# Patient Record
Sex: Female | Born: 2000
Health system: Southern US, Community
[De-identification: ages and names within clinical notes are randomized; demographics above are authoritative.]

## PROBLEM LIST (undated history)

## (undated) DIAGNOSIS — G43909 Migraine, unspecified, not intractable, without status migrainosus: Secondary | ICD-10-CM

## (undated) HISTORY — DX: Migraine, unspecified, not intractable, without status migrainosus: G43.909

---
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
## 2020-01-08 MED FILL — MEDROXYPROGESTERONE ACETATE: 150 | 90 days supply | Qty: 1 | Fill #1

## 2020-01-14 DIAGNOSIS — Z3042 Encounter for surveillance of injectable contraceptive: Secondary | ICD-10-CM | POA: Diagnosis not present

## 2020-01-14 DIAGNOSIS — Z3202 Encounter for pregnancy test, result negative: Secondary | ICD-10-CM | POA: Diagnosis not present

## 2020-04-03 MED FILL — MEDROXYPROGESTERONE ACETATE: 150 | 90 days supply | Qty: 1 | Fill #2

## 2020-04-07 DIAGNOSIS — Z3202 Encounter for pregnancy test, result negative: Secondary | ICD-10-CM | POA: Diagnosis not present

## 2020-04-07 DIAGNOSIS — Z3042 Encounter for surveillance of injectable contraceptive: Secondary | ICD-10-CM | POA: Diagnosis not present

## 2020-05-11 DIAGNOSIS — R300 Dysuria: Secondary | ICD-10-CM | POA: Diagnosis not present

## 2020-05-11 DIAGNOSIS — Z3009 Encounter for other general counseling and advice on contraception: Secondary | ICD-10-CM | POA: Diagnosis not present

## 2020-05-11 DIAGNOSIS — R39198 Other difficulties with micturition: Secondary | ICD-10-CM | POA: Diagnosis not present

## 2020-05-11 DIAGNOSIS — N390 Urinary tract infection, site not specified: Secondary | ICD-10-CM | POA: Diagnosis not present

## 2020-05-11 DIAGNOSIS — Z68.41 Body mass index (BMI) pediatric, greater than or equal to 95th percentile for age: Secondary | ICD-10-CM | POA: Diagnosis not present

## 2020-06-01 DIAGNOSIS — R300 Dysuria: Secondary | ICD-10-CM | POA: Diagnosis not present

## 2020-07-04 DIAGNOSIS — Z68.41 Body mass index (BMI) pediatric, greater than or equal to 95th percentile for age: Secondary | ICD-10-CM | POA: Diagnosis not present

## 2020-07-04 DIAGNOSIS — E669 Obesity, unspecified: Secondary | ICD-10-CM | POA: Diagnosis not present

## 2020-07-04 DIAGNOSIS — Z309 Encounter for contraceptive management, unspecified: Secondary | ICD-10-CM | POA: Diagnosis not present

## 2020-07-04 DIAGNOSIS — Z113 Encounter for screening for infections with a predominantly sexual mode of transmission: Secondary | ICD-10-CM | POA: Diagnosis not present

## 2020-07-07 MED FILL — medroxyPROGESTERone ACETATE: 150 | 90 days supply | Qty: 1 | Fill #3

## 2020-07-08 DIAGNOSIS — Z3042 Encounter for surveillance of injectable contraceptive: Secondary | ICD-10-CM | POA: Diagnosis not present

## 2020-07-08 DIAGNOSIS — Z3202 Encounter for pregnancy test, result negative: Secondary | ICD-10-CM | POA: Diagnosis not present

## 2020-09-09 ENCOUNTER — Other Ambulatory Visit: Payer: Self-pay

## 2020-09-09 ENCOUNTER — Encounter: Payer: Self-pay | Admitting: Emergency Medicine

## 2020-09-09 ENCOUNTER — Ambulatory Visit
Admission: EM | Admit: 2020-09-09 | Discharge: 2020-09-09 | Disposition: A | Discharge status: Home / Self Care | Payer: 59 | Attending: Emergency Medicine | Admitting: Emergency Medicine

## 2020-09-09 ENCOUNTER — Ambulatory Visit: Admit: 2020-09-09 | Disposition: A | Discharge status: Home / Self Care | Payer: Self-pay

## 2020-09-09 DIAGNOSIS — Z20822 Contact with and (suspected) exposure to covid-19: Secondary | ICD-10-CM | POA: Diagnosis not present

## 2020-09-09 DIAGNOSIS — J019 Acute sinusitis, unspecified: Secondary | ICD-10-CM | POA: Diagnosis not present

## 2020-09-09 MED ORDER — CETIRIZINE HCL 10 MG PO CAPS: 10.0000 mg | ORAL_CAPSULE | Freq: Every day | ORAL | 0 refills | Status: DC

## 2020-09-09 MED ORDER — FLUTICASONE PROPIONATE 50 MCG/ACT NA SUSP: 1.0000 | Freq: Every day | NASAL | 0 refills | Status: DC

## 2020-09-09 MED ORDER — IBUPROFEN 800 MG PO TABS: 800.0000 mg | ORAL_TABLET | Freq: Three times a day (TID) | ORAL | 0 refills | Status: DC

## 2020-09-09 MED ORDER — AMOXICILLIN-POT CLAVULANATE 875-125 MG PO TABS: 1.0000 | ORAL_TABLET | Freq: Two times a day (BID) | ORAL | 0 refills | Status: AC

## 2020-09-09 NOTE — ED Provider Notes (Signed)
[name]    CSN: [id number] Arrival date & time: 09/09/20  1204      History   Chief Complaint Chief Complaint  Patient presents with  . Nasal Congestion    HPI Laura [name] is a 20 y.o. female presenting today for evaluation of nasal congestion.  Reports that she has had URI symptoms for approximately 1 week.  Reports associated fatigue.  Did at home Covid test which was negative.  Reports a lot of pressure on left side of face and sensation of feeling congested, denies rhinorrhea.  Mild sore throat.  And frequent coughing related to drainage in throat.  Reports symptoms have been going on for 1 week, reports improved over the weekend, but worsened over the past 1 to 2 days.  HPI  History reviewed. No pertinent past medical history.  There are no problems to display for this patient.   History reviewed. No pertinent surgical history.  OB History   No obstetric history on file.      Home Medications    Prior to Admission medications   Medication Sig Start Date End Date Taking? Authorizing Provider  amoxicillin-clavulanate (AUGMENTIN) 875-125 MG tablet Take 1 tablet by mouth every 12 (twelve) hours for 7 days. 09/09/20 09/16/20 Yes Deetya Drouillard C, PA-C  Cetirizine HCl 10 MG CAPS Take 1 capsule (10 mg total) by mouth daily for 10 days. 09/09/20 09/19/20 Yes Donnamaria Shands C, PA-C  fluticasone (FLONASE) 50 MCG/ACT nasal spray Place 1-2 sprays into both nostrils daily. 09/09/20  Yes Kayna Suppa C, PA-C  ibuprofen (ADVIL) 800 MG tablet Take 1 tablet (800 mg total) by mouth 3 (three) times daily. 09/09/20  Yes Violette Morneault, Junius Creamer, PA-C    Family History History reviewed. No pertinent family history.  Social History     Allergies   Patient has no known allergies.   Review of Systems Review of Systems  Constitutional: Negative for activity change, appetite change, chills, fatigue and fever.  HENT: Positive for congestion, rhinorrhea and sinus  pressure. Negative for ear pain, sore throat and trouble swallowing.   Eyes: Negative for discharge and redness.  Respiratory: Negative for cough, chest tightness and shortness of breath.   Cardiovascular: Negative for chest pain.  Gastrointestinal: Negative for abdominal pain, diarrhea, nausea and vomiting.  Musculoskeletal: Negative for myalgias.  Skin: Negative for rash.  Neurological: Negative for dizziness, light-headedness and headaches.     Physical Exam Triage Vital Signs ED Triage Vitals [09/09/20 1231]  Enc Vitals Group     BP 122/67     Pulse Rate 90     Resp 18     Temp 98.7 F (37.1 C)     Temp Source Oral     SpO2 96 %     Weight      Height      Head Circumference      Peak Flow      Pain Score 4     Pain Loc      Pain Edu?      Excl. in GC?    No data found.  Updated Vital Signs BP 122/67 (BP Location: Left Arm)   Pulse 90   Temp 98.7 F (37.1 C) (Oral)   Resp 18   SpO2 96%   Visual Acuity Right Eye Distance:   Left Eye Distance:   Bilateral Distance:    Right Eye Near:   Left Eye Near:    Bilateral Near:     Physical Exam  Vitals and nursing note reviewed.  Constitutional:      Appearance: She is well-developed and well-nourished.     Comments: No acute distress  HENT:     Head: Normocephalic and atraumatic.     Ears:     Comments: Bilateral ears without tenderness to palpation of external auricle, tragus and mastoid, EAC's without erythema or swelling, TM's with good bony landmarks and cone of light. Non erythematous.     Nose: Nose normal.     Mouth/Throat:     Comments: Oral mucosa pink and moist, no tonsillar enlargement or exudate. Posterior pharynx patent and nonerythematous, no uvula deviation or swelling. Normal phonation. Eyes:     Conjunctiva/sclera: Conjunctivae normal.  Cardiovascular:     Rate and Rhythm: Normal rate.  Pulmonary:     Effort: Pulmonary effort is normal. No respiratory distress.     Comments: Breathing  comfortably at rest, CTABL, no wheezing, rales or other adventitious sounds auscultated Abdominal:     General: There is no distension.  Musculoskeletal:        General: Normal range of motion.     Cervical back: Neck supple.  Skin:    General: Skin is warm and dry.  Neurological:     Mental Status: She is alert and oriented to person, place, and time.  Psychiatric:        Mood and Affect: Mood and affect normal.      UC Treatments / Results  Labs (all labs ordered are listed, but only abnormal results are displayed) Labs Reviewed  NOVEL CORONAVIRUS, NAA    EKG   Radiology No results found.  Procedures Procedures (including critical care time)  Medications Ordered in UC Medications - No data to display  Initial Impression / Assessment and Plan / UC Course  I have reviewed the triage vital signs and the nursing notes.  Pertinent labs & imaging results that were available during my care of the patient were reviewed by me and considered in my medical decision making (see chart for details).     Treating for sinusitis-symptoms x1 week with double sickening, initiating on Augmentin, Flonase and Zyrtec, anti-inflammatories for headaches, rest and fluids.  Covid PCR pending to ensure rapid at home not a false negative.  Discussed strict return precautions. Patient verbalized understanding and is agreeable with plan.  Final Clinical Impressions(s) / UC Diagnoses   Final diagnoses:  Encounter for screening laboratory testing for COVID-19 virus  Acute sinusitis with symptoms > 10 days     Discharge Instructions     Begin Augmentin twice daily for the next week to treat sinus infection Flonase nasal spray and cetirizine daily to further help with congestion and drainage Ibuprofen and Tylenol for headaches Rest and drink plenty of fluids Follow-up if not improving or worsening    ED Prescriptions    Medication Sig Dispense Auth. Provider   amoxicillin-clavulanate  (AUGMENTIN) 875-125 MG tablet Take 1 tablet by mouth every 12 (twelve) hours for 7 days. 14 tablet Nerida Boivin C, PA-C   fluticasone (FLONASE) 50 MCG/ACT nasal spray Place 1-2 sprays into both nostrils daily. 16 g Elexia Friedt C, PA-C   Cetirizine HCl 10 MG CAPS Take 1 capsule (10 mg total) by mouth daily for 10 days. 10 capsule Azavion Bouillon C, PA-C   ibuprofen (ADVIL) 800 MG tablet Take 1 tablet (800 mg total) by mouth 3 (three) times daily. 21 tablet Talmadge Ganas, [place] C, PA-C     PDMP not reviewed this encounter.  Lew Dawes, [place] 09/09/20 1412

## 2020-09-09 NOTE — Discharge Instructions (Signed)
Begin Augmentin twice daily for the next week to treat sinus infection Flonase nasal spray and cetirizine daily to further help with congestion and drainage Ibuprofen and Tylenol for headaches Rest and drink plenty of fluids Follow-up if not improving or worsening

## 2020-09-09 NOTE — ED Triage Notes (Signed)
Pt sts nasal congestion and fatigue x 1 week; pt did home covid test was negative; pt sts not improved

## 2020-09-10 LAB — NOVEL CORONAVIRUS, NAA: SARS-CoV-2, NAA: NOT DETECTED

## 2020-09-10 LAB — SARS-COV-2, NAA 2 DAY TAT

## 2020-09-17 ENCOUNTER — Other Ambulatory Visit: Payer: Self-pay

## 2020-09-17 ENCOUNTER — Ambulatory Visit
Admission: RE | Admit: 2020-09-17 | Discharge: 2020-09-17 | Disposition: A | Discharge status: Home / Self Care | Payer: 59 | Source: Ambulatory Visit | Attending: Emergency Medicine | Admitting: Emergency Medicine

## 2020-09-17 VITALS — BP 110/66 | HR 96 | Temp 99.1°F | Resp 18

## 2020-09-17 DIAGNOSIS — Z113 Encounter for screening for infections with a predominantly sexual mode of transmission: Secondary | ICD-10-CM | POA: Diagnosis not present

## 2020-09-17 DIAGNOSIS — N898 Other specified noninflammatory disorders of vagina: Secondary | ICD-10-CM | POA: Insufficient documentation

## 2020-09-17 LAB — POCT URINALYSIS DIP (MANUAL ENTRY)
Bilirubin, UA: NEGATIVE
Blood, UA: NEGATIVE
Glucose, UA: NEGATIVE mg/dL
Ketones, POC UA: NEGATIVE mg/dL
Nitrite, UA: NEGATIVE
Protein Ur, POC: NEGATIVE mg/dL
Spec Grav, UA: 1.01 (ref 1.010–1.025)
Urobilinogen, UA: 0.2 E.U./dL
pH, UA: 6 (ref 5.0–8.0)

## 2020-09-17 LAB — POCT URINE PREGNANCY: Preg Test, Ur: NEGATIVE

## 2020-09-17 NOTE — ED Provider Notes (Signed)
[name]    CSN: [id number] Arrival date & time: 09/17/20  1133      History   Chief Complaint Chief Complaint  Patient presents with  . Appointment    1200  . Exposure to STD    HPI Laura [name] is a 20 y.o. female presenting today for evaluation of vaginal discharge.  Reports that she would like to be screened for STDs as well as UTI.  She does report some white discharge without itching or irritation.  Denies any new partners.  Denies urinary symptoms.  She receives Depo injections regularly, denies menstrual cycles with Depo.  She did notice slight discoloration to her clitoris, but denies associated pain or itching with this.  HPI  History reviewed. No pertinent past medical history.  There are no problems to display for this patient.   History reviewed. No pertinent surgical history.  OB History   No obstetric history on file.      Home Medications    Prior to Admission medications   Medication Sig Start Date End Date Taking? Authorizing Provider  Cetirizine HCl 10 MG CAPS Take 1 capsule (10 mg total) by mouth daily for 10 days. 09/09/20 09/19/20  Mahmud Keithly C, PA-C  fluticasone (FLONASE) 50 MCG/ACT nasal spray Place 1-2 sprays into both nostrils daily. 09/09/20   Digby Groeneveld C, PA-C  ibuprofen (ADVIL) 800 MG tablet Take 1 tablet (800 mg total) by mouth 3 (three) times daily. 09/09/20   Keria Widrig, Junius Creamer, PA-C    Family History Family History  Family history unknown: Yes    Social History Social History   Tobacco Use  . Smoking status: Never Smoker  . Smokeless tobacco: Never Used  Substance Use Topics  . Alcohol use: Not Currently  . Drug use: Never     Allergies   Patient has no known allergies.   Review of Systems Review of Systems  Constitutional: Negative for fever.  Respiratory: Negative for shortness of breath.   Cardiovascular: Negative for chest pain.  Gastrointestinal: Negative for abdominal pain, diarrhea,  nausea and vomiting.  Genitourinary: Positive for vaginal discharge. Negative for dysuria, flank pain, genital sores, hematuria, menstrual problem, vaginal bleeding and vaginal pain.  Musculoskeletal: Negative for back pain.  Skin: Negative for rash.  Neurological: Negative for dizziness, light-headedness and headaches.     Physical Exam Triage Vital Signs ED Triage Vitals  Enc Vitals Group     BP 09/17/20 1150 110/66     Pulse Rate 09/17/20 1150 96     Resp 09/17/20 1150 18     Temp 09/17/20 1150 99.1 F (37.3 C)     Temp Source 09/17/20 1150 Oral     SpO2 09/17/20 1150 95 %     Weight --      Height --      Head Circumference --      Peak Flow --      Pain Score 09/17/20 1151 3     Pain Loc --      Pain Edu? --      Excl. in GC? --    No data found.  Updated Vital Signs BP 110/66 (BP Location: Left Arm)   Pulse 96   Temp 99.1 F (37.3 C) (Oral)   Resp 18   SpO2 95%   Visual Acuity Right Eye Distance:   Left Eye Distance:   Bilateral Distance:    Right Eye Near:   Left Eye Near:    Bilateral Near:  Physical Exam Vitals and nursing note reviewed.  Constitutional:      Appearance: She is well-developed and well-nourished.     Comments: No acute distress  HENT:     Head: Normocephalic and atraumatic.     Nose: Nose normal.  Eyes:     Conjunctiva/sclera: Conjunctivae normal.  Cardiovascular:     Rate and Rhythm: Normal rate.  Pulmonary:     Effort: Pulmonary effort is normal. No respiratory distress.  Abdominal:     General: There is no distension.  Genitourinary:    Comments: No obvious rashes or lesions noted to labia majora and minora, pubic area, small feeling of mucosal.  Noted at crease of clitoral hood and clitoris, no open lesions or sores Musculoskeletal:        General: Normal range of motion.     Cervical back: Neck supple.  Skin:    General: Skin is warm and dry.  Neurological:     Mental Status: She is alert and oriented to person,  place, and time.  Psychiatric:        Mood and Affect: Mood and affect normal.      UC Treatments / Results  Labs (all labs ordered are listed, but only abnormal results are displayed) Labs Reviewed  POCT URINALYSIS DIP (MANUAL ENTRY) - Abnormal; Notable for the following components:      Result Value   Leukocytes, UA Trace (*)    All other components within normal limits  POCT URINE PREGNANCY  CERVICOVAGINAL ANCILLARY ONLY    EKG   Radiology No results found.  Procedures Procedures (including critical care time)  Medications Ordered in UC Medications - No data to display  Initial Impression / Assessment and Plan / UC Course  I have reviewed the triage vital signs and the nursing notes.  Pertinent labs & imaging results that were available during my care of the patient were reviewed by me and considered in my medical decision making (see chart for details).     UA unremarkable, pregnancy test negative, vaginal swab pending for screening of discharge.  Deferring empiric treatment, will call with results as needed.  Discussed strict return precautions. Patient verbalized understanding and is agreeable with plan.  Final Clinical Impressions(s) / UC Diagnoses   Final diagnoses:  Vaginal discharge  Screen for STD (sexually transmitted disease)     Discharge Instructions     We are testing you for Gonorrhea, Chlamydia, Trichomonas, Yeast and Bacterial Vaginosis. We will call you if anything is positive and let you know if you require any further treatment. Please inform partners of any positive results.   Please return if symptoms not improving with treatment, development of fever, nausea, vomiting, abdominal pain.     ED Prescriptions    None     PDMP not reviewed this encounter.   Lew Dawes, PA-C 09/17/20 1302

## 2020-09-17 NOTE — Discharge Instructions (Signed)
We are testing you for Gonorrhea, Chlamydia, Trichomonas, Yeast and Bacterial Vaginosis. We will call you if anything is positive and let you know if you require any further treatment. Please inform partners of any positive results.   Please return if symptoms not improving with treatment, development of fever, nausea, vomiting, abdominal pain.  

## 2020-09-17 NOTE — ED Triage Notes (Signed)
Pt here for STD check as well as check for UTI; pt sts vaginal discharge

## 2020-09-18 ENCOUNTER — Telehealth (HOSPITAL_COMMUNITY): Payer: Self-pay | Admitting: Emergency Medicine

## 2020-09-18 LAB — CERVICOVAGINAL ANCILLARY ONLY
Bacterial Vaginitis (gardnerella): NEGATIVE
Candida Glabrata: NEGATIVE
Candida Vaginitis: POSITIVE — AB
Chlamydia: NEGATIVE
Comment: NEGATIVE
Comment: NEGATIVE
Comment: NEGATIVE
Comment: NEGATIVE
Comment: NEGATIVE
Comment: NORMAL
Neisseria Gonorrhea: NEGATIVE
Trichomonas: NEGATIVE

## 2020-09-18 MED ORDER — FLUCONAZOLE 150 MG PO TABS: 150.0000 mg | ORAL_TABLET | Freq: Once | ORAL | 0 refills | Status: AC

## 2020-09-23 ENCOUNTER — Other Ambulatory Visit (HOSPITAL_COMMUNITY): Payer: Self-pay | Admitting: Pediatrics

## 2020-09-23 MED FILL — medroxyPROGESTERone ACETATE: 150 | 90 days supply | Qty: 1 | Fill #0

## 2020-09-26 DIAGNOSIS — Z3042 Encounter for surveillance of injectable contraceptive: Secondary | ICD-10-CM | POA: Diagnosis not present

## 2020-09-26 DIAGNOSIS — Z3202 Encounter for pregnancy test, result negative: Secondary | ICD-10-CM | POA: Diagnosis not present

## 2020-10-08 ENCOUNTER — Other Ambulatory Visit: Payer: Self-pay

## 2020-10-08 ENCOUNTER — Ambulatory Visit
Admission: RE | Admit: 2020-10-08 | Discharge: 2020-10-08 | Disposition: A | Discharge status: Home / Self Care | Payer: 59 | Source: Ambulatory Visit | Attending: Family Medicine | Admitting: Family Medicine

## 2020-10-08 VITALS — BP 107/67 | HR 105 | Temp 98.4°F | Resp 18

## 2020-10-08 DIAGNOSIS — W57XXXA Bitten or stung by nonvenomous insect and other nonvenomous arthropods, initial encounter: Secondary | ICD-10-CM | POA: Diagnosis not present

## 2020-10-08 DIAGNOSIS — R21 Rash and other nonspecific skin eruption: Secondary | ICD-10-CM

## 2020-10-08 MED ORDER — PREDNISONE 20 MG PO TABS: 40.0000 mg | ORAL_TABLET | Freq: Every day | ORAL | 0 refills | Status: DC

## 2020-10-08 MED ORDER — TRIAMCINOLONE ACETONIDE 0.1 % EX CREA: 1.0000 "application " | TOPICAL_CREAM | Freq: Two times a day (BID) | CUTANEOUS | 0 refills | Status: DC | PRN

## 2020-10-08 NOTE — ED Triage Notes (Signed)
Pt presents with bug bites all over body after jet skiing in [place] last week

## 2020-10-08 NOTE — ED Provider Notes (Signed)
[name]    CSN: [id number] Arrival date & time: 10/08/20  1037      History   Chief Complaint Chief Complaint  Patient presents with  . Insect Bite    HPI Laura [name] is a 20 y.o. female.   Patient presenting today with itchy bug bites all over her body excluding face that started while jet skiing in [place] on vacation 3 days ago.  She states they get very red and swollen particularly after a warm bath.  She has been trying over-the-counter Aveeno itch cream, oatmeal baths, moisturizers and antihistamines without relief.  Denies fever, chills, drainage, wheezing, shortness of breath, nausea.     No past medical history on file.  There are no problems to display for this patient.   No past surgical history on file.  OB History   No obstetric history on file.      Home Medications    Prior to Admission medications   Medication Sig Start Date End Date Taking? Authorizing Provider  predniSONE (DELTASONE) 20 MG tablet Take 2 tablets (40 mg total) by mouth daily with breakfast. 10/08/20  Yes Particia Nearing, PA-C  triamcinolone (KENALOG) 0.1 % Apply 1 application topically 2 (two) times daily as needed. 10/08/20  Yes Particia Nearing, PA-C  Cetirizine HCl 10 MG CAPS Take 1 capsule (10 mg total) by mouth daily for 10 days. 09/09/20 09/19/20  Wieters, Hallie C, PA-C  fluticasone (FLONASE) 50 MCG/ACT nasal spray Place 1-2 sprays into both nostrils daily. 09/09/20   Wieters, Hallie C, PA-C  ibuprofen (ADVIL) 800 MG tablet Take 1 tablet (800 mg total) by mouth 3 (three) times daily. 09/09/20   Wieters, Junius Creamer, PA-C    Family History Family History  Family history unknown: Yes    Social History Social History   Tobacco Use  . Smoking status: Never Smoker  . Smokeless tobacco: Never Used  Substance Use Topics  . Alcohol use: Not Currently  . Drug use: Never     Allergies   Patient has no known allergies.   Review of Systems Review  of Systems Per HPI  Physical Exam Triage Vital Signs ED Triage Vitals [10/08/20 1055]  Enc Vitals Group     BP 107/67     Pulse Rate (!) 105     Resp 18     Temp 98.4 F (36.9 C)     Temp src      SpO2 97 %     Weight      Height      Head Circumference      Peak Flow      Pain Score      Pain Loc      Pain Edu?      Excl. in GC?    No data found.  Updated Vital Signs BP 107/67   Pulse (!) 105   Temp 98.4 F (36.9 C)   Resp 18   SpO2 97%   Visual Acuity Right Eye Distance:   Left Eye Distance:   Bilateral Distance:    Right Eye Near:   Left Eye Near:    Bilateral Near:     Physical Exam Vitals and nursing note reviewed.  Constitutional:      Appearance: Normal appearance. She is not ill-appearing.  HENT:     Head: Atraumatic.  Eyes:     Extraocular Movements: Extraocular movements intact.     Conjunctiva/sclera: Conjunctivae normal.  Cardiovascular:  Rate and Rhythm: Normal rate and regular rhythm.     Heart sounds: Normal heart sounds.  Pulmonary:     Effort: Pulmonary effort is normal.     Breath sounds: Normal breath sounds.  Musculoskeletal:        General: Normal range of motion.     Cervical back: Normal range of motion and neck supple.  Skin:    General: Skin is warm and dry.     Comments: Erythematous papular rash across extremities and trunk, at some places in clusters but mostly sporadic placement.  No drainage or surrounding erythema  Neurological:     Mental Status: She is alert and oriented to person, place, and time.  Psychiatric:        Mood and Affect: Mood normal.        Thought Content: Thought content normal.        Judgment: Judgment normal.      UC Treatments / Results  Labs (all labs ordered are listed, but only abnormal results are displayed) Labs Reviewed - No data to display  EKG   Radiology No results found.  Procedures Procedures (including critical care time)  Medications Ordered in UC Medications -  No data to display  Initial Impression / Assessment and Plan / UC Course  I have reviewed the triage vital signs and the nursing notes.  Pertinent labs & imaging results that were available during my care of the patient were reviewed by me and considered in my medical decision making (see chart for details).     Consistent with significant amount of insect bites, unclear if mosquito or otherwise.  We will treat with prednisone burst, triamcinolone cream as needed, antihistamines, good moisturizer regimen.  Follow-up with primary care if not fully resolving.  Final Clinical Impressions(s) / UC Diagnoses   Final diagnoses:  Rash  Insect bite, unspecified site, initial encounter   Discharge Instructions   None    ED Prescriptions    Medication Sig Dispense Auth. Provider   predniSONE (DELTASONE) 20 MG tablet Take 2 tablets (40 mg total) by mouth daily with breakfast. 10 tablet Particia Nearing, PA-C   triamcinolone (KENALOG) 0.1 % Apply 1 application topically 2 (two) times daily as needed. 90 g Particia Nearing, [place]     PDMP not reviewed this encounter.   Particia Nearing, [place] 10/08/20 1111

## 2020-10-22 ENCOUNTER — Other Ambulatory Visit (HOSPITAL_COMMUNITY): Payer: Self-pay | Admitting: Pediatrics

## 2020-10-22 DIAGNOSIS — Z0000 Encounter for general adult medical examination without abnormal findings: Secondary | ICD-10-CM | POA: Diagnosis not present

## 2020-10-22 DIAGNOSIS — S40861A Insect bite (nonvenomous) of right upper arm, initial encounter: Secondary | ICD-10-CM | POA: Diagnosis not present

## 2020-10-22 DIAGNOSIS — Z23 Encounter for immunization: Secondary | ICD-10-CM | POA: Diagnosis not present

## 2020-10-22 DIAGNOSIS — R8290 Unspecified abnormal findings in urine: Secondary | ICD-10-CM | POA: Diagnosis not present

## 2020-10-22 DIAGNOSIS — Z7251 High risk heterosexual behavior: Secondary | ICD-10-CM | POA: Diagnosis not present

## 2020-10-22 DIAGNOSIS — Z1389 Encounter for screening for other disorder: Secondary | ICD-10-CM | POA: Diagnosis not present

## 2020-10-22 DIAGNOSIS — F1290 Cannabis use, unspecified, uncomplicated: Secondary | ICD-10-CM | POA: Diagnosis not present

## 2020-10-22 DIAGNOSIS — Z0001 Encounter for general adult medical examination with abnormal findings: Secondary | ICD-10-CM | POA: Diagnosis not present

## 2020-10-22 DIAGNOSIS — Z68.41 Body mass index (BMI) pediatric, greater than or equal to 95th percentile for age: Secondary | ICD-10-CM | POA: Diagnosis not present

## 2020-10-22 MED FILL — HYDROXYZINE HCL 25 MG TABS: 25 | 7 days supply | Qty: 21 | Fill #0

## 2020-10-24 DIAGNOSIS — A64 Unspecified sexually transmitted disease: Secondary | ICD-10-CM | POA: Diagnosis not present

## 2020-10-24 DIAGNOSIS — Z7251 High risk heterosexual behavior: Secondary | ICD-10-CM | POA: Diagnosis not present

## 2020-10-24 DIAGNOSIS — Z68.41 Body mass index (BMI) pediatric, greater than or equal to 95th percentile for age: Secondary | ICD-10-CM | POA: Diagnosis not present

## 2020-12-18 ENCOUNTER — Other Ambulatory Visit (HOSPITAL_COMMUNITY): Payer: Self-pay

## 2020-12-18 MED ORDER — MEDROXYPROGESTERONE ACETATE 150 MG/ML IM SUSY
PREFILLED_SYRINGE | INTRAMUSCULAR | 0 refills | Status: DC
  Filled 2020-12-18: qty 1, 90d supply, fill #0

## 2020-12-19 DIAGNOSIS — Z3202 Encounter for pregnancy test, result negative: Secondary | ICD-10-CM | POA: Diagnosis not present

## 2020-12-19 DIAGNOSIS — Z3042 Encounter for surveillance of injectable contraceptive: Secondary | ICD-10-CM | POA: Diagnosis not present

## 2020-12-19 DIAGNOSIS — Z7251 High risk heterosexual behavior: Secondary | ICD-10-CM | POA: Diagnosis not present

## 2020-12-23 ENCOUNTER — Other Ambulatory Visit (HOSPITAL_COMMUNITY): Payer: Self-pay

## 2021-03-04 ENCOUNTER — Other Ambulatory Visit (HOSPITAL_COMMUNITY): Payer: Self-pay

## 2021-03-04 MED ORDER — MEDROXYPROGESTERONE ACETATE 150 MG/ML IM SUSY
PREFILLED_SYRINGE | INTRAMUSCULAR | 1 refills | Status: DC
  Filled 2021-03-04: qty 1, 90d supply, fill #0

## 2021-03-05 ENCOUNTER — Other Ambulatory Visit (HOSPITAL_COMMUNITY): Payer: Self-pay

## 2021-03-05 MED ORDER — MEDROXYPROGESTERONE ACETATE 150 MG/ML IM SUSY
PREFILLED_SYRINGE | INTRAMUSCULAR | 2 refills | Status: DC
  Filled 2021-03-05 – 2021-06-04 (×3): qty 1, 90d supply, fill #0
  Filled 2021-08-20 – 2021-08-25 (×2): qty 1, 90d supply, fill #1
  Filled 2021-11-19: qty 1, 90d supply, fill #2

## 2021-03-09 ENCOUNTER — Other Ambulatory Visit (HOSPITAL_COMMUNITY): Payer: Self-pay

## 2021-03-10 DIAGNOSIS — Z3202 Encounter for pregnancy test, result negative: Secondary | ICD-10-CM | POA: Diagnosis not present

## 2021-03-10 DIAGNOSIS — Z3042 Encounter for surveillance of injectable contraceptive: Secondary | ICD-10-CM | POA: Diagnosis not present

## 2021-05-14 ENCOUNTER — Ambulatory Visit
Admission: EM | Admit: 2021-05-14 | Discharge: 2021-05-14 | Disposition: A | Discharge status: Home / Self Care | Payer: 59 | Attending: Physician Assistant | Admitting: Physician Assistant

## 2021-05-14 ENCOUNTER — Encounter: Payer: Self-pay | Admitting: Emergency Medicine

## 2021-05-14 ENCOUNTER — Other Ambulatory Visit: Payer: Self-pay

## 2021-05-14 DIAGNOSIS — R8290 Unspecified abnormal findings in urine: Secondary | ICD-10-CM | POA: Diagnosis not present

## 2021-05-14 LAB — POCT URINALYSIS DIP (MANUAL ENTRY)
Bilirubin, UA: NEGATIVE
Glucose, UA: NEGATIVE mg/dL
Leukocytes, UA: NEGATIVE
Nitrite, UA: NEGATIVE
Protein Ur, POC: NEGATIVE mg/dL
Spec Grav, UA: 1.025 (ref 1.010–1.025)
Urobilinogen, UA: 0.2 E.U./dL
pH, UA: 5.5 (ref 5.0–8.0)

## 2021-05-14 LAB — POCT URINE PREGNANCY: Preg Test, Ur: NEGATIVE

## 2021-05-14 NOTE — ED Triage Notes (Signed)
Patient c/o possible UTI, strong odor to urine x 1 week, no urgency, frequency or dysuria.  Patient has been spotting since Saturday.

## 2021-05-14 NOTE — ED Provider Notes (Signed)
[name]    CSN: [id number] Arrival date & time: 05/14/21  1101      History   Chief Complaint Chief Complaint  Patient presents with   Possible UTI    HPI ROBECCA [name] is a 20 y.o. female.   Patient here today for evaluation of malodorous urine that she noticed a few days ago.  She denies any dysuria, urinary frequency or vaginal discharge.  She denies any abdominal pain, or back pain.  She has not had any nausea or vomiting.  She does not report any treatment for symptoms.  She states in the past when she has had malodorous urine she has had UTI and just wanted to be sure this was not the case.  She declines STD screening.  The history is provided by the patient.   History reviewed. No pertinent past medical history.  There are no problems to display for this patient.   History reviewed. No pertinent surgical history.  OB History   No obstetric history on file.      Home Medications    Prior to Admission medications   Medication Sig Start Date End Date Taking? Authorizing Provider  fluticasone (FLONASE) 50 MCG/ACT nasal spray Place 1-2 sprays into both nostrils daily. 09/09/20  Yes Wieters, Hallie C, PA-C  hydrOXYzine (ATARAX/VISTARIL) 25 MG tablet TAKE 1 TABLET BY MOUTH EVERY 8 HOURS FOR 7 DAYS AS NEEDED FOR ITCHING 10/22/20 10/22/21 Yes Whitlock, Madison J, NP  ibuprofen (ADVIL) 800 MG tablet Take 1 tablet (800 mg total) by mouth 3 (three) times daily. 09/09/20  Yes Wieters, Hallie C, PA-C  medroxyPROGESTERone Acetate (DEPO-PROVERA) 150 MG/ML SUSY Inject 1 vial intramuscularly every 3 months 03/04/21  Yes   medroxyPROGESTERone Acetate 150 MG/ML SUSY INJECT 1 SYRINGE INTO THE MUSCLE EVERY 3 MONTHS AS DIRECTED 09/23/20 09/23/21 Yes Barbie Banner, MD  medroxyPROGESTERone Acetate 150 MG/ML SUSY Inject 1 vial intramuscularly every 3 months 03/05/21  Yes   predniSONE (DELTASONE) 20 MG tablet Take 2 tablets (40 mg total) by mouth daily with breakfast. 10/08/20   Yes Particia Nearing, PA-C  triamcinolone (KENALOG) 0.1 % Apply 1 application topically 2 (two) times daily as needed. 10/08/20  Yes Particia Nearing, PA-C  Cetirizine HCl 10 MG CAPS Take 1 capsule (10 mg total) by mouth daily for 10 days. 09/09/20 09/19/20  Wieters, Junius Creamer, PA-C    Family History Family History  Family history unknown: Yes    Social History Social History   Tobacco Use   Smoking status: Never   Smokeless tobacco: Never  Substance Use Topics   Alcohol use: Not Currently   Drug use: Never     Allergies   Patient has no known allergies.   Review of Systems Review of Systems  Constitutional:  Negative for chills and fever.  Respiratory:  Negative for shortness of breath.   Gastrointestinal:  Negative for abdominal pain, nausea and vomiting.  Genitourinary:  Negative for dysuria and frequency.  Musculoskeletal:  Negative for back pain.    Physical Exam Triage Vital Signs ED Triage Vitals  Enc Vitals Group     BP      Pulse      Resp      Temp      Temp src      SpO2      Weight      Height      Head Circumference      Peak Flow  Pain Score      Pain Loc      Pain Edu?      Excl. in GC?    No data found.  Updated Vital Signs BP 128/82 (BP Location: Left Arm)   Pulse 68   Temp 97.7 F (36.5 C) (Oral)   Ht 5\' 2"  (1.575 m)   Wt 174 lb (78.9 kg)   LMP  (LMP Unknown)   SpO2 97%   BMI 31.83 kg/m     Physical Exam Vitals and nursing note reviewed.  Constitutional:      General: She is not in acute distress.    Appearance: Normal appearance. She is not ill-appearing.  HENT:     Head: Normocephalic and atraumatic.  Eyes:     Conjunctiva/sclera: Conjunctivae normal.  Cardiovascular:     Rate and Rhythm: Normal rate.  Pulmonary:     Effort: Pulmonary effort is normal. No respiratory distress.  Skin:    General: Skin is warm and dry.  Neurological:     Mental Status: She is alert.  Psychiatric:        Mood and  Affect: Mood normal.        Thought Content: Thought content normal.     UC Treatments / Results  Labs (all labs ordered are listed, but only abnormal results are displayed) Labs Reviewed  POCT URINALYSIS DIP (MANUAL ENTRY) - Abnormal; Notable for the following components:      Result Value   Ketones, POC UA trace (5) (*)    Blood, UA trace-intact (*)    All other components within normal limits  URINE CULTURE  POCT URINE PREGNANCY    EKG   Radiology No results found.  Procedures Procedures (including critical care time)  Medications Ordered in UC Medications - No data to display  Initial Impression / Assessment and Plan / UC Course  I have reviewed the triage vital signs and the nursing notes.  Pertinent labs & imaging results that were available during my care of the patient were reviewed by me and considered in my medical decision making (see chart for details).  Reassured patient that urinalysis did not show any findings consistent with UTI but will order urine culture for further evaluation.  Encouraged follow-up with any further concerns.  Final Clinical Impressions(s) / UC Diagnoses   Final diagnoses:  Abnormal urine odor   Discharge Instructions   None    ED Prescriptions   None    PDMP not reviewed this encounter.   , PA-C 05/14/21 1331

## 2021-05-18 LAB — URINE CULTURE: Culture: 50000 — AB

## 2021-05-20 ENCOUNTER — Telehealth: Payer: Self-pay

## 2021-05-20 MED ORDER — NITROFURANTOIN MONOHYD MACRO 100 MG PO CAPS
100.0000 mg | ORAL_CAPSULE | Freq: Two times a day (BID) | ORAL | 0 refills | Status: DC
Start: 2021-05-20 — End: 2022-01-06

## 2021-06-03 ENCOUNTER — Other Ambulatory Visit (HOSPITAL_COMMUNITY): Payer: Self-pay

## 2021-06-04 ENCOUNTER — Other Ambulatory Visit (HOSPITAL_COMMUNITY): Payer: Self-pay

## 2021-06-04 DIAGNOSIS — Z3042 Encounter for surveillance of injectable contraceptive: Secondary | ICD-10-CM | POA: Diagnosis not present

## 2021-06-04 DIAGNOSIS — Z3202 Encounter for pregnancy test, result negative: Secondary | ICD-10-CM | POA: Diagnosis not present

## 2021-08-20 ENCOUNTER — Other Ambulatory Visit (HOSPITAL_COMMUNITY): Payer: Self-pay

## 2021-08-25 ENCOUNTER — Other Ambulatory Visit (HOSPITAL_COMMUNITY): Payer: Self-pay

## 2021-08-26 DIAGNOSIS — Z3042 Encounter for surveillance of injectable contraceptive: Secondary | ICD-10-CM | POA: Diagnosis not present

## 2021-08-26 DIAGNOSIS — Z3202 Encounter for pregnancy test, result negative: Secondary | ICD-10-CM | POA: Diagnosis not present

## 2021-11-19 ENCOUNTER — Other Ambulatory Visit (HOSPITAL_COMMUNITY): Payer: Self-pay

## 2022-01-06 ENCOUNTER — Ambulatory Visit
Admission: RE | Admit: 2022-01-06 | Discharge: 2022-01-06 | Disposition: A | Discharge status: Home / Self Care | Payer: 59 | Source: Ambulatory Visit | Attending: Internal Medicine | Admitting: Internal Medicine

## 2022-01-06 VITALS — BP 108/74 | HR 65 | Temp 98.3°F | Resp 18

## 2022-01-06 DIAGNOSIS — N760 Acute vaginitis: Secondary | ICD-10-CM | POA: Insufficient documentation

## 2022-01-06 DIAGNOSIS — N3001 Acute cystitis with hematuria: Secondary | ICD-10-CM | POA: Insufficient documentation

## 2022-01-06 LAB — POCT URINALYSIS DIP (MANUAL ENTRY)
Bilirubin, UA: NEGATIVE
Glucose, UA: NEGATIVE mg/dL
Ketones, POC UA: NEGATIVE mg/dL
Leukocytes, UA: NEGATIVE
Nitrite, UA: POSITIVE — AB
Protein Ur, POC: NEGATIVE mg/dL
Spec Grav, UA: >=1.03 — AB (ref 1.010–1.025)
Urobilinogen, UA: 0.2 E.U./dL
pH, UA: 5.5 (ref 5.0–8.0)

## 2022-01-06 LAB — POCT URINE PREGNANCY: Preg Test, Ur: NEGATIVE

## 2022-01-06 MED ORDER — CEPHALEXIN 500 MG PO CAPS: 500.0000 mg | ORAL_CAPSULE | Freq: Two times a day (BID) | ORAL | 0 refills | Status: AC

## 2022-01-06 NOTE — ED Provider Notes (Signed)
Laura [name]    CSN: [id number] Arrival date & time: 01/06/22  1045      History   Chief Complaint Chief Complaint  Patient presents with   Vaginal Discharge    Possible uti just want to check - Entered by patient    HPI Laura [name] is a 21 y.o. female comes to the urgent [name] with 3-day history of whitish vaginal discharge.  Patient denies dysuria urgency or frequency.  No flank pain.  No fever or chills.  Patient is sexually active.  She engages in unprotected sexual intercourse.  She engages in vaginal as well as oral sex.  No pain on swallowing.  No sore throat.  No fever or chills.  No rash in the mouth.  No rash in the groin area.  No diarrhea.  Patient recently got off birth control and has had light menses in the past 3 weeks.  She would like to be tested for pregnancy.Marland Kitchen   HPI  History reviewed. No pertinent past medical history.  There are no problems to display for this patient.   History reviewed. No pertinent surgical history.  OB History   No obstetric history on file.      Home Medications    Prior to Admission medications   Medication Sig Start Date End Date Taking? Authorizing Provider  cephALEXin (KEFLEX) 500 MG capsule Take 1 capsule (500 mg total) by mouth 2 (two) times daily for 5 days. 01/06/22 01/11/22 Yes Roshanda Balazs, Britta Mccreedy, MD  Cetirizine HCl 10 MG CAPS Take 1 capsule (10 mg total) by mouth daily for 10 days. 09/09/20 09/19/20  Wieters, Hallie C, PA-C  fluticasone (FLONASE) 50 MCG/ACT nasal spray Place 1-2 sprays into both nostrils daily. 09/09/20   Wieters, Hallie C, PA-C  ibuprofen (ADVIL) 800 MG tablet Take 1 tablet (800 mg total) by mouth 3 (three) times daily. 09/09/20   Wieters, Hallie C, PA-C  triamcinolone (KENALOG) 0.1 % Apply 1 application topically 2 (two) times daily as needed. 10/08/20   Particia Nearing, PA-C    Family History Family History  Family history unknown: Yes    Social History Social History   Tobacco  Use   Smoking status: Never   Smokeless tobacco: Never  Substance Use Topics   Alcohol use: Not Currently   Drug use: Never     Allergies   Patient has no known allergies.   Review of Systems Review of Systems  Cardiovascular: Negative.   Gastrointestinal: Negative.   Genitourinary:  Positive for hematuria, urgency and vaginal discharge. Negative for dysuria and vaginal pain.  Musculoskeletal: Negative.      Physical Exam Triage Vital Signs ED Triage Vitals [01/06/22 1103]  Enc Vitals Group     BP 108/74     Pulse Rate 65     Resp 18     Temp 98.3 F (36.8 C)     Temp Source Oral     SpO2 98 %     Weight      Height      Head Circumference      Peak Flow      Pain Score 0     Pain Loc      Pain Edu?      Excl. in GC?    No data found.  Updated Vital Signs BP 108/74 (BP Location: Right Arm)   Pulse 65   Temp 98.3 F (36.8 C) (Oral)   Resp 18   SpO2 98%  Visual Acuity Right Eye Distance:   Left Eye Distance:   Bilateral Distance:    Right Eye Near:   Left Eye Near:    Bilateral Near:     Physical Exam Vitals and nursing note reviewed.  Constitutional:      Appearance: Normal appearance.  Cardiovascular:     Rate and Rhythm: Normal rate and regular rhythm.     Pulses: Normal pulses.     Heart sounds: Normal heart sounds.  Pulmonary:     Effort: Pulmonary effort is normal.     Breath sounds: Normal breath sounds.  Abdominal:     General: Bowel sounds are normal.     Palpations: Abdomen is soft.  Neurological:     Mental Status: She is alert.      UC Treatments / Results  Labs (all labs ordered are listed, but only abnormal results are displayed) Labs Reviewed  POCT URINALYSIS DIP (MANUAL ENTRY) - Abnormal; Notable for the following components:      Result Value   Clarity, UA cloudy (*)    Spec Grav, UA >=1.030 (*)    Blood, UA trace-intact (*)    Nitrite, UA Positive (*)    All other components within normal limits  URINE  CULTURE  POCT URINE PREGNANCY  CERVICOVAGINAL ANCILLARY ONLY    EKG   Radiology No results found.  Procedures Procedures (including critical [name] time)  Medications Ordered in UC Medications - No data to display  Initial Impression / Assessment and Plan / UC Course  I have reviewed the triage vital signs and the nursing notes.  Pertinent labs & imaging results that were available during my [name] of the patient were reviewed by me and considered in my medical decision making (see chart for details).     1.  Acute vaginitis: Cervical vaginal swab for GC/chlamydia/trichomonas/bacterial vaginosis/vaginal yeast. Safe sex practices recommended. Please abstain from sexual intercourse until lab results are available Return to urgent [name] if you have any other concerns  2.  Acute cystitis with hematuria: Point-of-[name] urinalysis is positive for hemoglobin, leukocyte Estrace and protein as well as nitrite Urine cultures have been sent Keflex 500 mg twice daily for 5 days Patient is advised to return to urgent [name] if symptoms worsen Return precautions given. Final Clinical Impressions(s) / UC Diagnoses   Final diagnoses:  Acute cystitis with hematuria  Acute vaginitis     Discharge Instructions      Increase oral fluid intake Take antibiotics as prescribed We will call you with recommendations if labs are abnormal Return to urgent [name] if you have any other concerns   ED Prescriptions     Medication Sig Dispense Auth. Provider   cephALEXin (KEFLEX) 500 MG capsule Take 1 capsule (500 mg total) by mouth 2 (two) times daily for 5 days. 10 capsule Raylene Carmickle, Britta Mccreedy, MD      PDMP not reviewed this encounter.   Merrilee Jansky, MD 01/06/22 1302

## 2022-01-06 NOTE — ED Triage Notes (Signed)
Pt c/o vaginal discharge and irritation onset ~ Monday. Requesting pregnancy test and oral cyto in addition to standard tests.

## 2022-01-06 NOTE — Discharge Instructions (Addendum)
Increase oral fluid intake Take antibiotics as prescribed We will call you with recommendations if labs are abnormal Return to urgent care if you have any other concerns

## 2022-01-07 ENCOUNTER — Telehealth (HOSPITAL_COMMUNITY): Payer: Self-pay | Admitting: Emergency Medicine

## 2022-01-07 LAB — CERVICOVAGINAL ANCILLARY ONLY
Bacterial Vaginitis (gardnerella): POSITIVE — AB
Candida Glabrata: NEGATIVE
Candida Vaginitis: POSITIVE — AB
Chlamydia: NEGATIVE
Comment: NEGATIVE
Comment: NEGATIVE
Comment: NEGATIVE
Comment: NEGATIVE
Comment: NEGATIVE
Comment: NORMAL
Neisseria Gonorrhea: NEGATIVE
Trichomonas: NEGATIVE

## 2022-01-07 MED ORDER — METRONIDAZOLE 500 MG PO TABS: 500.0000 mg | ORAL_TABLET | Freq: Two times a day (BID) | ORAL | 0 refills | Status: DC

## 2022-01-07 MED ORDER — FLUCONAZOLE 150 MG PO TABS: 150.0000 mg | ORAL_TABLET | Freq: Once | ORAL | 0 refills | Status: AC

## 2022-01-08 LAB — URINE CULTURE
Culture: >=100000 — AB
Special Requests: NORMAL

## 2022-02-24 DIAGNOSIS — N926 Irregular menstruation, unspecified: Secondary | ICD-10-CM | POA: Diagnosis not present

## 2022-02-24 DIAGNOSIS — Z7251 High risk heterosexual behavior: Secondary | ICD-10-CM | POA: Diagnosis not present

## 2022-02-24 DIAGNOSIS — E669 Obesity, unspecified: Secondary | ICD-10-CM | POA: Diagnosis not present

## 2022-02-24 DIAGNOSIS — Z0000 Encounter for general adult medical examination without abnormal findings: Secondary | ICD-10-CM | POA: Diagnosis not present

## 2022-02-24 DIAGNOSIS — Z0001 Encounter for general adult medical examination with abnormal findings: Secondary | ICD-10-CM | POA: Diagnosis not present

## 2022-02-24 DIAGNOSIS — Z3042 Encounter for surveillance of injectable contraceptive: Secondary | ICD-10-CM | POA: Diagnosis not present

## 2022-02-24 DIAGNOSIS — Z6834 Body mass index (BMI) 34.0-34.9, adult: Secondary | ICD-10-CM | POA: Diagnosis not present

## 2022-02-24 DIAGNOSIS — F1290 Cannabis use, unspecified, uncomplicated: Secondary | ICD-10-CM | POA: Diagnosis not present

## 2022-02-24 DIAGNOSIS — N920 Excessive and frequent menstruation with regular cycle: Secondary | ICD-10-CM | POA: Diagnosis not present

## 2022-02-26 DIAGNOSIS — Z3042 Encounter for surveillance of injectable contraceptive: Secondary | ICD-10-CM | POA: Diagnosis not present

## 2022-02-26 DIAGNOSIS — Z3202 Encounter for pregnancy test, result negative: Secondary | ICD-10-CM | POA: Diagnosis not present

## 2022-04-21 ENCOUNTER — Ambulatory Visit (INDEPENDENT_AMBULATORY_CARE_PROVIDER_SITE_OTHER): Payer: 59 | Admitting: Primary Care

## 2022-05-05 ENCOUNTER — Ambulatory Visit
Admission: EM | Admit: 2022-05-05 | Discharge: 2022-05-05 | Disposition: A | Discharge status: Home / Self Care | Payer: 59 | Attending: Internal Medicine | Admitting: Internal Medicine

## 2022-05-05 DIAGNOSIS — R110 Nausea: Secondary | ICD-10-CM

## 2022-05-05 DIAGNOSIS — R519 Headache, unspecified: Secondary | ICD-10-CM | POA: Diagnosis not present

## 2022-05-05 MED ORDER — ONDANSETRON 4 MG PO TBDP
4.0000 mg | ORAL_TABLET | Freq: Once | ORAL | Status: AC
  Administered 2022-05-05: 4 mg via ORAL

## 2022-05-05 MED ORDER — DEXAMETHASONE SODIUM PHOSPHATE 10 MG/ML IJ SOLN
10.0000 mg | Freq: Once | INTRAMUSCULAR | Status: AC
  Administered 2022-05-05: 10 mg via INTRAMUSCULAR

## 2022-05-05 NOTE — ED Provider Notes (Signed)
[name]    CSN: [id number] Arrival date & time: 05/05/22  1937      History   Chief Complaint Chief Complaint  Patient presents with   Headache    HPI Laura [name] is a 21 y.o. female.   Patient presents with constant, generalized headache that has been present for about 3 weeks.  Patient reports intermittent dizziness and nausea without vomiting as well.  Denies any associated blurred vision, chest pain, shortness of breath.  Patient denies that she has a history of migraines.  Denies any recent falls or head trauma.  Patient has taken 800 mg ibuprofen yesterday as well as 2 doses of Excedrin today with no improvement in headache.  Denies any associated nasal congestion or upper respiratory symptoms.  Last menstrual cycle was about 3 months ago but patient reports this is baseline given that she takes Depo.  Patient is adamant that she is not concerned for pregnancy.  Denies this is the worst headache of her life.  She reports history of headaches with associated fever but denies any current fever.   Headache   History reviewed. No pertinent past medical history.  There are no problems to display for this patient.   History reviewed. No pertinent surgical history.  OB History   No obstetric history on file.      Home Medications    Prior to Admission medications   Medication Sig Start Date End Date Taking? Authorizing Provider  Cetirizine HCl 10 MG CAPS Take 1 capsule (10 mg total) by mouth daily for 10 days. 09/09/20 09/19/20  Wieters, Hallie C, PA-C  fluticasone (FLONASE) 50 MCG/ACT nasal spray Place 1-2 sprays into both nostrils daily. 09/09/20   Wieters, Hallie C, PA-C  ibuprofen (ADVIL) 800 MG tablet Take 1 tablet (800 mg total) by mouth 3 (three) times daily. 09/09/20   Wieters, Hallie C, PA-C  metroNIDAZOLE (FLAGYL) 500 MG tablet Take 1 tablet (500 mg total) by mouth 2 (two) times daily. 01/07/22   Lamptey, Britta Mccreedy, MD  triamcinolone (KENALOG)  0.1 % Apply 1 application topically 2 (two) times daily as needed. 10/08/20   Particia Nearing, PA-C    Family History Family History  Family history unknown: Yes    Social History Social History   Tobacco Use   Smoking status: Never   Smokeless tobacco: Never  Substance Use Topics   Alcohol use: Not Currently   Drug use: Never     Allergies   Patient has no known allergies.   Review of Systems Review of Systems Per HPI  Physical Exam Triage Vital Signs ED Triage Vitals [05/05/22 1948]  Enc Vitals Group     BP 118/80     Pulse Rate 83     Resp 16     Temp 98 F (36.7 C)     Temp Source Oral     SpO2 97 %     Weight      Height      Head Circumference      Peak Flow      Pain Score 9     Pain Loc      Pain Edu?      Excl. in GC?    No data found.  Updated Vital Signs BP 118/80 (BP Location: Left Arm)   Pulse 83   Temp 98 F (36.7 C) (Oral)   Resp 16   SpO2 97%   Visual Acuity Right Eye Distance:   Left Eye  Distance:   Bilateral Distance:    Right Eye Near:   Left Eye Near:    Bilateral Near:     Physical Exam Constitutional:      General: She is not in acute distress.    Appearance: Normal appearance. She is not toxic-appearing or diaphoretic.  HENT:     Head: Normocephalic and atraumatic.  Eyes:     Extraocular Movements: Extraocular movements intact.     Conjunctiva/sclera: Conjunctivae normal.     Pupils: Pupils are equal, round, and reactive to light.  Cardiovascular:     Rate and Rhythm: Normal rate and regular rhythm.     Pulses: Normal pulses.     Heart sounds: Normal heart sounds.  Pulmonary:     Effort: Pulmonary effort is normal. No respiratory distress.     Breath sounds: Normal breath sounds.  Neurological:     General: No focal deficit present.     Mental Status: She is alert and oriented to person, place, and time. Mental status is at baseline.     Cranial Nerves: Cranial nerves 2-12 are intact.     Sensory:  Sensation is intact.     Motor: Motor function is intact.     Coordination: Coordination is intact.     Gait: Gait is intact.  Psychiatric:        Mood and Affect: Mood normal.        Behavior: Behavior normal.        Thought Content: Thought content normal.        Judgment: Judgment normal.      UC Treatments / Results  Labs (all labs ordered are listed, but only abnormal results are displayed) Labs Reviewed - No data to display  EKG   Radiology No results found.  Procedures Procedures (including critical care time)  Medications Ordered in UC Medications  ondansetron (ZOFRAN-ODT) disintegrating tablet 4 mg (4 mg Oral Given 05/05/22 2002)  dexamethasone (DECADRON) injection 10 mg (10 mg Intramuscular Given 05/05/22 2002)    Initial Impression / Assessment and Plan / UC Course  I have reviewed the triage vital signs and the nursing notes.  Pertinent labs & imaging results that were available during my care of the patient were reviewed by me and considered in my medical decision making (see chart for details).     Vital signs, physical exam, neuro exam are all stable.  Therefore, do not think that any emergent evaluation or imaging of the head is necessary at this time.  Will give IM Decadron and Zofran ODT in urgent care to help alleviate symptoms.  Given that patient recently took Excedrin, will defer IM Toradol given safety.  Patient advised to not take any additional NSAIDs or Excedrin for at least 24 hours following injection.  Patient was also advised to go to the emergency department if headache does not improve or worsens in the next 24 to 48 hours despite medications given in urgent care.  Patient was adamant that there is no concern for pregnancy so a pregnancy test was deferred.  Patient verbalized understanding and was agreeable with plan. Final Clinical Impressions(s) / UC Diagnoses   Final diagnoses:  Acute intractable headache, unspecified headache type   Nausea without vomiting     Discharge Instructions      You were given an injection and nausea medication here in urgent care today to help alleviate headache and nausea.  Please do not take any additional ibuprofen, Advil, Aleve, Excedrin for at least 24  hours following this injection.  Please go the hospital emergency department if symptoms persist or worsen in the next 24 to 48 hours.    ED Prescriptions   None    PDMP not reviewed this encounter.   Gustavus Bryant, [place] 05/05/22 2020

## 2022-05-05 NOTE — Discharge Instructions (Signed)
You were given an injection and nausea medication here in urgent care today to help alleviate headache and nausea.  Please do not take any additional ibuprofen, Advil, Aleve, Excedrin for at least 24 hours following this injection.  Please go the hospital emergency department if symptoms persist or worsen in the next 24 to 48 hours.

## 2022-05-05 NOTE — ED Triage Notes (Signed)
Pt c/o migraines every day for the last 3 weeks specifically after "I'm mad or I'm irritated" states they start at work. Associated nausea.

## 2022-05-13 DIAGNOSIS — Z20828 Contact with and (suspected) exposure to other viral communicable diseases: Secondary | ICD-10-CM | POA: Diagnosis not present

## 2022-05-13 DIAGNOSIS — R519 Headache, unspecified: Secondary | ICD-10-CM | POA: Diagnosis not present

## 2022-05-13 DIAGNOSIS — Z3042 Encounter for surveillance of injectable contraceptive: Secondary | ICD-10-CM | POA: Diagnosis not present

## 2022-05-13 DIAGNOSIS — Z30013 Encounter for initial prescription of injectable contraceptive: Secondary | ICD-10-CM | POA: Diagnosis not present

## 2022-05-13 DIAGNOSIS — J019 Acute sinusitis, unspecified: Secondary | ICD-10-CM | POA: Diagnosis not present

## 2022-05-13 DIAGNOSIS — G4489 Other headache syndrome: Secondary | ICD-10-CM | POA: Diagnosis not present

## 2022-05-13 DIAGNOSIS — Z3202 Encounter for pregnancy test, result negative: Secondary | ICD-10-CM | POA: Diagnosis not present

## 2022-05-13 DIAGNOSIS — Z6834 Body mass index (BMI) 34.0-34.9, adult: Secondary | ICD-10-CM | POA: Diagnosis not present

## 2022-05-13 DIAGNOSIS — J020 Streptococcal pharyngitis: Secondary | ICD-10-CM | POA: Diagnosis not present

## 2022-06-26 ENCOUNTER — Ambulatory Visit
Admission: EM | Admit: 2022-06-26 | Discharge: 2022-06-26 | Disposition: A | Discharge status: Home / Self Care | Payer: 59 | Attending: Internal Medicine | Admitting: Internal Medicine

## 2022-06-26 DIAGNOSIS — L03113 Cellulitis of right upper limb: Secondary | ICD-10-CM | POA: Diagnosis not present

## 2022-06-26 MED ORDER — DOXYCYCLINE HYCLATE 100 MG PO CAPS: 100.0000 mg | ORAL_CAPSULE | Freq: Two times a day (BID) | ORAL | 0 refills | Status: DC

## 2022-06-26 MED ORDER — CEFTRIAXONE SODIUM 1 G IJ SOLR
1.0000 g | Freq: Once | INTRAMUSCULAR | Status: AC
  Administered 2022-06-26: 1 g via INTRAMUSCULAR

## 2022-06-26 NOTE — Discharge Instructions (Signed)
I am treating you with an antibiotic injection and antibiotic pills that have been sent to the pharmacy.  Please monitor this closely over the next 48 hours.  If you do not see any improvement in the next 48 hours or if it worsens, please go straight to the emergency department.

## 2022-06-26 NOTE — ED Provider Notes (Signed)
[name]    CSN: [id number] Arrival date & time: 06/26/22  1410      History   Chief Complaint Chief Complaint  Patient presents with   Abscess    HPI Laura [name] is a 21 y.o. female.   Patient presents with concerns for abscess to right armpit.  She reports that it started approximately 5 days ago.  She states that it started off as a pimple-like lesion and is now extended into her upper arm.  Denies any drainage from the area.  Denies fever, body aches, chills.  She has taken ibuprofen with improvement in pain.  Denies any prior history of abscesses.   Abscess   History reviewed. No pertinent past medical history.  There are no problems to display for this patient.   History reviewed. No pertinent surgical history.  OB History   No obstetric history on file.      Home Medications    Prior to Admission medications   Medication Sig Start Date End Date Taking? Authorizing Provider  doxycycline (VIBRAMYCIN) 100 MG capsule Take 1 capsule (100 mg total) by mouth 2 (two) times daily. 06/26/22  Yes Ion Gonnella, Rolly Salter E, FNP  Cetirizine HCl 10 MG CAPS Take 1 capsule (10 mg total) by mouth daily for 10 days. 09/09/20 09/19/20  Wieters, Hallie C, PA-C  fluticasone (FLONASE) 50 MCG/ACT nasal spray Place 1-2 sprays into both nostrils daily. 09/09/20   Wieters, Hallie C, PA-C  ibuprofen (ADVIL) 800 MG tablet Take 1 tablet (800 mg total) by mouth 3 (three) times daily. 09/09/20   Wieters, Hallie C, PA-C  metroNIDAZOLE (FLAGYL) 500 MG tablet Take 1 tablet (500 mg total) by mouth 2 (two) times daily. 01/07/22   Lamptey, Britta Mccreedy, MD  triamcinolone (KENALOG) 0.1 % Apply 1 application topically 2 (two) times daily as needed. 10/08/20   Particia Nearing, PA-C    Family History Family History  Family history unknown: Yes    Social History Social History   Tobacco Use   Smoking status: Never   Smokeless tobacco: Never  Substance Use Topics   Alcohol use: Not  Currently   Drug use: Never     Allergies   Patient has no known allergies.   Review of Systems Review of Systems Per HPI  Physical Exam Triage Vital Signs ED Triage Vitals [06/26/22 1502]  Enc Vitals Group     BP 116/80     Pulse Rate 90     Resp 16     Temp 98 F (36.7 C)     Temp Source Oral     SpO2 98 %     Weight      Height      Head Circumference      Peak Flow      Pain Score 8     Pain Loc      Pain Edu?      Excl. in GC?    No data found.  Updated Vital Signs BP 116/80 (BP Location: Left Arm)   Pulse 90   Temp 98 F (36.7 C) (Oral)   Resp 16   SpO2 98%   Visual Acuity Right Eye Distance:   Left Eye Distance:   Bilateral Distance:    Right Eye Near:   Left Eye Near:    Bilateral Near:     Physical Exam Constitutional:      General: She is not in acute distress.    Appearance: Normal appearance. She is  not toxic-appearing or diaphoretic.  HENT:     Head: Normocephalic and atraumatic.  Eyes:     Extraocular Movements: Extraocular movements intact.     Conjunctiva/sclera: Conjunctivae normal.  Pulmonary:     Effort: Pulmonary effort is normal.  Skin:    Comments: Patient has warmth and erythema that is flat present starting at right axilla area and extends into inner right upper arm.  No obvious lesion, fluctuance, induration.  Neurological:     General: No focal deficit present.     Mental Status: She is alert and oriented to person, place, and time. Mental status is at baseline.  Psychiatric:        Mood and Affect: Mood normal.        Behavior: Behavior normal.        Thought Content: Thought content normal.        Judgment: Judgment normal.      UC Treatments / Results  Labs (all labs ordered are listed, but only abnormal results are displayed) Labs Reviewed - No data to display  EKG   Radiology No results found.  Procedures Procedures (including critical care time)  Medications Ordered in UC Medications   cefTRIAXone (ROCEPHIN) injection 1 g (has no administration in time range)    Initial Impression / Assessment and Plan / UC Course  I have reviewed the triage vital signs and the nursing notes.  Pertinent labs & imaging results that were available during my care of the patient were reviewed by me and considered in my medical decision making (see chart for details).     Patient's physical exam is concerning for cellulitis to right upper arm.  There is no obvious abscess noted or any area that would be conducive for drainage.  Patient  has extensive erythema and warmth up right upper arm.  I am treating with IM Rocephin and doxycycline.  Advised patient to monitor this very closely over the next 48 hours and to go straight to the ER if it persists or worsens.  Patient verbalized understanding and was agreeable with plan. Final Clinical Impressions(s) / UC Diagnoses   Final diagnoses:  Cellulitis of right arm     Discharge Instructions      I am treating you with an antibiotic injection and antibiotic pills that have been sent to the pharmacy.  Please monitor this closely over the next 48 hours.  If you do not see any improvement in the next 48 hours or if it worsens, please go straight to the emergency department.    ED Prescriptions     Medication Sig Dispense Auth. Provider   doxycycline (VIBRAMYCIN) 100 MG capsule Take 1 capsule (100 mg total) by mouth 2 (two) times daily. 20 capsule Gustavus Bryant, [place]      PDMP not reviewed this encounter.   Gustavus Bryant, [place] 06/26/22 1524

## 2022-06-26 NOTE — ED Triage Notes (Signed)
Pt c/o abscess started tues in underarm area that has enlarged to include portion of the arm.

## 2022-07-08 ENCOUNTER — Encounter: Payer: Self-pay | Admitting: Nurse Practitioner

## 2022-07-08 ENCOUNTER — Ambulatory Visit: Payer: 59 | Admitting: Nurse Practitioner

## 2022-07-08 ENCOUNTER — Other Ambulatory Visit (HOSPITAL_COMMUNITY)
Admission: RE | Admit: 2022-07-08 | Discharge: 2022-07-08 | Disposition: A | Discharge status: Home / Self Care | Payer: 59 | Source: Ambulatory Visit | Attending: Nurse Practitioner | Admitting: Nurse Practitioner

## 2022-07-08 VITALS — BP 114/72 | HR 84 | Temp 97.2°F | Ht 62.0 in | Wt 192.4 lb

## 2022-07-08 DIAGNOSIS — Z3009 Encounter for other general counseling and advice on contraception: Secondary | ICD-10-CM

## 2022-07-08 DIAGNOSIS — N898 Other specified noninflammatory disorders of vagina: Secondary | ICD-10-CM | POA: Diagnosis not present

## 2022-07-08 DIAGNOSIS — N912 Amenorrhea, unspecified: Secondary | ICD-10-CM | POA: Diagnosis not present

## 2022-07-08 DIAGNOSIS — G43709 Chronic migraine without aura, not intractable, without status migrainosus: Secondary | ICD-10-CM | POA: Insufficient documentation

## 2022-07-08 DIAGNOSIS — G43009 Migraine without aura, not intractable, without status migrainosus: Secondary | ICD-10-CM

## 2022-07-08 LAB — POCT URINE PREGNANCY: Preg Test, Ur: NEGATIVE

## 2022-07-08 MED ORDER — SUMATRIPTAN SUCCINATE 25 MG PO TABS: 25.0000 mg | ORAL_TABLET | ORAL | 0 refills | Status: DC | PRN

## 2022-07-08 MED ORDER — FLUCONAZOLE 150 MG PO TABS: ORAL_TABLET | ORAL | 0 refills | Status: DC

## 2022-07-08 NOTE — Progress Notes (Signed)
New Patient Visit  BP 114/72   Pulse 84   Temp (!) 97.2 F (36.2 C) (Temporal)   Ht 5\' 2"  (1.575 m)   Wt 192 lb 6.4 oz (87.3 kg)   LMP  (LMP Unknown)   SpO2 95%   BMI 35.19 kg/m    Subjective:    Patient ID: , female    DOB: 2001-06-30, 21 y.o.   MRN: [id number]  CC: Chief Complaint  Patient presents with   New Patient (Initial Visit)    New pt, having white discharge in urine, notice on Tuesday, no itching  or burning , having headache 4 times , no tigger she said that she would wake up with headache , they would last all day , she would take medication to help , discuss getting her next depo shot next month     HPI: Laura [name] is a 21 y.o. female presents for new patient visit to establish care.  Introduced to 36 role and practice setting.  All questions answered.  Discussed provider/patient relationship and expectations.  She has a history of migraines. She was given ibuprofen 800mg  from her pediatrician and was told to monitor her headaches. She has headaches almost every day for the last week. The last time she had headaches was in October. They stay in one spot on her forehead. She notices a sensitivity to bright lights. She denies nausea and vomiting. The headaches tend to last about 12 hours. She takes the ibuprofen twice day. She has been taking this almost every day for the last 4 days.   She has noticed some white discharge sitting on the outside of her vagina. The last time this happened she had a yeast infection. She denies fevers, dysuria, urinary frequency. She was given an antibiotic recently for a skin abscess.   Depression Screen Done:     07/08/2022    8:22 AM  Depression screen PHQ 2/9  Decreased Interest 0  Down, Depressed, Hopeless 0  PHQ - 2 Score 0    Past Medical History:  Diagnosis Date   Migraines     History reviewed. No pertinent surgical history.  Family History  Problem Relation Age of Onset    Hypertension Mother      Social History   Tobacco Use   Smoking status: Never   Smokeless tobacco: Never  Vaping Use   Vaping Use: Never used  Substance Use Topics   Alcohol use: Yes    Alcohol/week: 2.0 standard drinks of alcohol    Types: 2 Shots of liquor per week    Comment: twice a week   Drug use: Yes    Types: Marijuana    Comment: twice a week    Current Outpatient Medications on File Prior to Visit  Medication Sig Dispense Refill   ibuprofen (ADVIL) 800 MG tablet Take 1 tablet (800 mg total) by mouth 3 (three) times daily. 21 tablet 0   No current facility-administered medications on file prior to visit.     Review of Systems  Constitutional:  Positive for fatigue. Negative for fever.  HENT: Negative.    Eyes: Negative.   Respiratory: Negative.    Cardiovascular: Negative.   Gastrointestinal: Negative.   Genitourinary:  Positive for vaginal discharge. Negative for dysuria and frequency.  Musculoskeletal: Negative.   Skin: Negative.   Neurological: Negative.   Psychiatric/Behavioral: Negative.        Objective:    BP 114/72   Pulse 84  Temp (!) 97.2 F (36.2 C) (Temporal)   Ht 5\' 2"  (1.575 m)   Wt 192 lb 6.4 oz (87.3 kg)   LMP  (LMP Unknown)   SpO2 95%   BMI 35.19 kg/m   Wt Readings from Last 3 Encounters:  07/08/22 192 lb 6.4 oz (87.3 kg)  05/14/21 174 lb (78.9 kg) (93 %, Z= 1.47)*   * Growth percentiles are based on CDC (Girls, 2-20 Years) data.    BP Readings from Last 3 Encounters:  07/08/22 114/72  06/26/22 116/80  05/05/22 118/80    Physical Exam Vitals and nursing note reviewed.  Constitutional:      General: She is not in acute distress.    Appearance: Normal appearance.  HENT:     Head: Normocephalic and atraumatic.     Right Ear: Tympanic membrane, ear canal and external ear normal.     Left Ear: Tympanic membrane, ear canal and external ear normal.  Eyes:     Conjunctiva/sclera: Conjunctivae normal.  Cardiovascular:      Rate and Rhythm: Normal rate and regular rhythm.     Pulses: Normal pulses.     Heart sounds: Normal heart sounds.  Pulmonary:     Effort: Pulmonary effort is normal.     Breath sounds: Normal breath sounds.  Abdominal:     Palpations: Abdomen is soft.     Tenderness: There is no abdominal tenderness.  Musculoskeletal:        General: Normal range of motion.     Cervical back: Normal range of motion and neck supple.     Right lower leg: No edema.     Left lower leg: No edema.  Lymphadenopathy:     Cervical: No cervical adenopathy.  Skin:    General: Skin is warm and dry.  Neurological:     General: No focal deficit present.     Mental Status: She is alert and oriented to person, place, and time.     Cranial Nerves: No cranial nerve deficit.     Coordination: Coordination normal.     Gait: Gait normal.  Psychiatric:        Mood and Affect: Mood normal.        Behavior: Behavior normal.        Thought Content: Thought content normal.        Judgment: Judgment normal.        Assessment & Plan:   Problem List Items Addressed This Visit       Cardiovascular and Mediastinum   Migraine without aura and without status migrainosus, not intractable    She has been having headaches associated with sensitivity to light for the last 4 days. She also was having headaches back in October. She can continue ibuprofen as needed, however will also start her on Imitrex 25mg  prn migraine. Follow-up in 4-6 weeks.       Relevant Medications   SUMAtriptan (IMITREX) 25 MG tablet     Other   Birth control counseling    She is currently getting depo provera injections and would like to continue this. She is due again for her next injection in January.       Other Visit Diagnoses     Vaginal discharge    -  Primary   She recently had antibiotics for skin abscess. Will give her diflucan 150mg  x1. Check urine for BV and yeast.   Relevant Orders   Urine cytology ancillary only   Amenorrhea  Most likely from depo provera injections. Will check pregnancy test per request   Relevant Orders   POCT urine pregnancy (Completed)        Follow up plan: Return in about 4 weeks (around 08/05/2022) for 4-6 weeks headaches.

## 2022-07-08 NOTE — Assessment & Plan Note (Signed)
She is currently getting depo provera injections and would like to continue this. She is due again for her next injection in January.

## 2022-07-08 NOTE — Patient Instructions (Signed)
It was great to see you!  Start imitrex 1 tablet at the first sign of a headache. You can take a second tablet in 2 hours if needed.   Take 1 tablet of the diflucan for your yeast infection. You can take a second tablet in 2 hours if needed.   Let's follow-up in 4-6 weeks, sooner if you have concerns.  If a referral was placed today, you will be contacted for an appointment. Please note that routine referrals can sometimes take up to 3-4 weeks to process. Please call our office if you haven't heard anything after this time frame.  Take care,  Rodman Pickle, NP

## 2022-07-08 NOTE — Assessment & Plan Note (Signed)
She has been having headaches associated with sensitivity to light for the last 4 days. She also was having headaches back in October. She can continue ibuprofen as needed, however will also start her on Imitrex 25mg  prn migraine. Follow-up in 4-6 weeks.

## 2022-07-09 LAB — URINE CYTOLOGY ANCILLARY ONLY
Bacterial Vaginitis-Urine: NEGATIVE
Candida Urine: NEGATIVE
Chlamydia: NEGATIVE
Comment: NEGATIVE
Comment: NEGATIVE
Comment: NORMAL
Neisseria Gonorrhea: NEGATIVE
Trichomonas: NEGATIVE

## 2022-08-05 ENCOUNTER — Ambulatory Visit: Payer: 59

## 2022-08-06 ENCOUNTER — Telehealth: Payer: Self-pay | Admitting: Nurse Practitioner

## 2022-08-06 NOTE — Telephone Encounter (Signed)
Called and spoke with patient informed pt that we have the Depo here in the office, that she will not need to go to the pharmacy to pick up before her appt. Pt also wanted to know if  this appt was for Phyiscal, I advised the pt this was for a physical , pt was concern because she had a change in her insurance. Informed pt that this was cpe and that she will be able to get injection the same day. Pt understood with out any concerns

## 2022-08-06 NOTE — Telephone Encounter (Signed)
Pt called and stated she would like depo prescripton

## 2022-08-09 ENCOUNTER — Telehealth: Payer: Self-pay | Admitting: Nurse Practitioner

## 2022-08-09 ENCOUNTER — Ambulatory Visit (INDEPENDENT_AMBULATORY_CARE_PROVIDER_SITE_OTHER): Payer: 59 | Admitting: Nurse Practitioner

## 2022-08-09 ENCOUNTER — Encounter: Payer: Self-pay | Admitting: Nurse Practitioner

## 2022-08-09 VITALS — BP 124/80 | HR 76 | Temp 98.5°F | Ht 61.0 in | Wt 195.2 lb

## 2022-08-09 DIAGNOSIS — D72819 Decreased white blood cell count, unspecified: Secondary | ICD-10-CM | POA: Diagnosis not present

## 2022-08-09 DIAGNOSIS — Z0000 Encounter for general adult medical examination without abnormal findings: Secondary | ICD-10-CM

## 2022-08-09 DIAGNOSIS — Z13220 Encounter for screening for lipoid disorders: Secondary | ICD-10-CM

## 2022-08-09 DIAGNOSIS — G43009 Migraine without aura, not intractable, without status migrainosus: Secondary | ICD-10-CM | POA: Diagnosis not present

## 2022-08-09 DIAGNOSIS — Z3009 Encounter for other general counseling and advice on contraception: Secondary | ICD-10-CM | POA: Diagnosis not present

## 2022-08-09 LAB — HM HEPATITIS C SCREENING LAB: HM Hepatitis Screen: NEGATIVE

## 2022-08-09 LAB — HM HIV SCREENING LAB: HM HIV Screening: NEGATIVE

## 2022-08-09 MED ORDER — MEDROXYPROGESTERONE ACETATE 150 MG/ML IM SUSP
150.0000 mg | Freq: Once | INTRAMUSCULAR | Status: AC
  Administered 2022-08-09: 150 mg via INTRAMUSCULAR

## 2022-08-09 NOTE — Assessment & Plan Note (Signed)
Chronic, stable. The imitrex has been helping with her migraines. Will continue 25mg  daily prn migraine. Follow-up in 6 months.

## 2022-08-09 NOTE — Assessment & Plan Note (Signed)
She is currently taking depo provera injections, due today. Will give 150mg  today and follow-up in 3 months for a nurse visit for next dose.

## 2022-08-09 NOTE — Patient Instructions (Signed)
It was great to see you!  We are checking your labs today and will let you know the results via mychart/phone.   Let me know if you need a refill of your migraine medication.   Let's follow-up in 6 months, sooner if you have concerns.  If a referral was placed today, you will be contacted for an appointment. Please note that routine referrals can sometimes take up to 3-4 weeks to process. Please call our office if you haven't heard anything after this time frame.  Take care,  Vance Peper, NP

## 2022-08-09 NOTE — Progress Notes (Unsigned)
BP 124/80   Pulse 76   Temp 98.5 F (36.9 C)   Ht 5\' 1"  (1.549 m)   Wt 195 lb 3.2 oz (88.5 kg)   SpO2 95%   BMI 36.88 kg/m    Subjective:    Patient ID: Laura [name], female    DOB: [birth year], 22 y.o.   MRN: [id number]  CC: Chief Complaint  Patient presents with   Annual Exam    Physical and depo , pt has no concerns     HPI: Laura [name] is a 22 y.o. female presenting on 08/09/2022 for comprehensive medical examination. Current medical complaints include:none   Depression Screen done today and results listed below:     08/09/2022    4:18 PM 07/08/2022    8:22 AM  Depression screen PHQ 2/9  Decreased Interest 0 0  Down, Depressed, Hopeless 0 0  PHQ - 2 Score 0 0    The patient does not have a history of falls. I did not complete a risk assessment for falls. A plan of care for falls was not documented.   Past Medical History:  Past Medical History:  Diagnosis Date   Migraines     Surgical History:  History reviewed. No pertinent surgical history.  Medications:  Current Outpatient Medications on File Prior to Visit  Medication Sig   ibuprofen (ADVIL) 800 MG tablet Take 1 tablet (800 mg total) by mouth 3 (three) times daily.   medroxyPROGESTERone Acetate 150 MG/ML SUSY SMARTSIG:1 Vial(s) IM Every 3 Months   SUMAtriptan (IMITREX) 25 MG tablet Take 1 tablet (25 mg total) by mouth every 2 (two) hours as needed for migraine. May repeat in 2 hours if headache persists or recurs.   No current facility-administered medications on file prior to visit.    Allergies:  No Known Allergies  Social History:  Social History   Socioeconomic History   Marital status: Single    Spouse name: Not on file   Number of children: Not on file   Years of education: Not on file   Highest education level: Not on file  Occupational History   Not on file  Tobacco Use   Smoking status: Never   Smokeless tobacco: Never  Vaping Use   Vaping Use: Never used  Substance  and Sexual Activity   Alcohol use: Yes    Alcohol/week: 2.0 standard drinks of alcohol    Types: 2 Shots of liquor per week    Comment: twice a week   Drug use: Yes    Types: Marijuana    Comment: twice a week   Sexual activity: Not on file    Comment: depo  Other Topics Concern   Not on file  Social History Narrative   Not on file   Social Determinants of Health   Financial Resource Strain: Not on file  Food Insecurity: Not on file  Transportation Needs: Not on file  Physical Activity: Not on file  Stress: Not on file  Social Connections: Not on file  Intimate Partner Violence: Not on file   Social History   Tobacco Use  Smoking Status Never  Smokeless Tobacco Never   Social History   Substance and Sexual Activity  Alcohol Use Yes   Alcohol/week: 2.0 standard drinks of alcohol   Types: 2 Shots of liquor per week   Comment: twice a week    Family History:  Family History  Problem Relation Age of Onset   Hypertension Mother  Past medical history, surgical history, medications, allergies, family history and social history reviewed with patient today and changes made to appropriate areas of the chart.   Review of Systems  Constitutional: Negative.   HENT: Negative.    Eyes: Negative.   Respiratory: Negative.    Cardiovascular: Negative.   Gastrointestinal: Negative.   Genitourinary: Negative.   Musculoskeletal: Negative.   Skin: Negative.   Neurological: Negative.   Psychiatric/Behavioral: Negative.     All other ROS negative except what is listed above and in the HPI.      Objective:    BP 124/80   Pulse 76   Temp 98.5 F (36.9 C)   Ht 5\' 1"  (1.549 m)   Wt 195 lb 3.2 oz (88.5 kg)   SpO2 95%   BMI 36.88 kg/m   Wt Readings from Last 3 Encounters:  08/09/22 195 lb 3.2 oz (88.5 kg)  07/08/22 192 lb 6.4 oz (87.3 kg)  05/14/21 174 lb (78.9 kg) (93 %, Z= 1.47)*   * Growth percentiles are based on CDC (Girls, 2-20 Years) data.    Physical  Exam Vitals and nursing note reviewed.  Constitutional:      General: She is not in acute distress.    Appearance: Normal appearance.  HENT:     Head: Normocephalic and atraumatic.     Right Ear: Tympanic membrane, ear canal and external ear normal.     Left Ear: Tympanic membrane, ear canal and external ear normal.  Eyes:     Conjunctiva/sclera: Conjunctivae normal.  Cardiovascular:     Rate and Rhythm: Normal rate and regular rhythm.     Pulses: Normal pulses.     Heart sounds: Normal heart sounds.  Pulmonary:     Effort: Pulmonary effort is normal.     Breath sounds: Normal breath sounds.  Abdominal:     Palpations: Abdomen is soft.     Tenderness: There is no abdominal tenderness.  Musculoskeletal:        General: Normal range of motion.     Cervical back: Normal range of motion and neck supple.     Right lower leg: No edema.     Left lower leg: No edema.  Lymphadenopathy:     Cervical: No cervical adenopathy.  Skin:    General: Skin is warm and dry.  Neurological:     General: No focal deficit present.     Mental Status: She is alert and oriented to person, place, and time.     Cranial Nerves: No cranial nerve deficit.     Coordination: Coordination normal.     Gait: Gait normal.  Psychiatric:        Mood and Affect: Mood normal.        Behavior: Behavior normal.        Thought Content: Thought content normal.        Judgment: Judgment normal.     Results for orders placed or performed in visit on 08/09/22  [facility] HIV SCREENING LAB  Result Value Ref Range   [facility] HIV Screening Negative - Patient reported   [facility] HEPATITIS C SCREENING LAB  Result Value Ref Range   [facility] Hepatitis Screen Negative - Patient Reported       Assessment & Plan:   Problem List Items Addressed This Visit       Cardiovascular and Mediastinum   Migraine without aura and without status migrainosus, not intractable    Chronic, stable. The imitrex has been helping with her  migraines. Will  continue 25mg  daily prn migraine. Follow-up in 6 months.         Other   Birth control counseling    She is currently taking depo provera injections, due today. Will give 150mg  today and follow-up in 3 months for a nurse visit for next dose.       Relevant Medications   medroxyPROGESTERone (DEPO-PROVERA) injection 150 mg (Start on 08/09/2022  4:30 PM)   Other Visit Diagnoses     Routine general medical examination at a health care facility    -  Primary   Health maintenance reviewed and updated. Discussed nutrition, exercise. Check CMP, CBC. Follow-up 1 year   Relevant Orders   CBC with Differential/Platelet   Comprehensive metabolic panel   Screening, lipid       Screen lipid panel today   Relevant Orders   Lipid panel        Follow up plan: Return in about 6 months (around 02/07/2023) for migraine.   LABORATORY TESTING:  - Pap smear:  declined today  IMMUNIZATIONS:   - Tdap: Tetanus vaccination status reviewed: last tetanus booster within 10 years. - Influenza: Refused - Pneumovax: Not applicable - Prevnar: Not applicable - HPV: Up to date - Zostavax vaccine: Not applicable  SCREENING: -Mammogram: Not applicable  - Colonoscopy: Not applicable  - Bone Density: Not applicable  -Hearing Test: Not applicable  -Spirometry: Not applicable   PATIENT COUNSELING:   Advised to take 1 mg of folate supplement per day if capable of pregnancy.   Sexuality: Discussed sexually transmitted diseases, partner selection, use of condoms, avoidance of unintended pregnancy  and contraceptive alternatives.   Advised to avoid cigarette smoking.  I discussed with the patient that most people either abstain from alcohol or drink within safe limits (<=14/week and <=4 drinks/occasion for males, <=7/weeks and <= 3 drinks/occasion for females) and that the risk for alcohol disorders and other health effects rises proportionally with the number of drinks per week and how often a drinker  exceeds daily limits.  Discussed cessation/primary prevention of drug use and availability of treatment for abuse.   Diet: Encouraged to adjust caloric intake to maintain  or achieve ideal body weight, to reduce intake of dietary saturated fat and total fat, to limit sodium intake by avoiding high sodium foods and not adding table salt, and to maintain adequate dietary potassium and calcium preferably from fresh fruits, vegetables, and low-fat dairy products.    stressed the importance of regular exercise  Injury prevention: Discussed safety belts, safety helmets, smoke detector, smoking near bedding or upholstery.   Dental health: Discussed importance of regular tooth brushing, flossing, and dental visits.    NEXT PREVENTATIVE PHYSICAL DUE IN 1 YEAR. Return in about 6 months (around 02/07/2023) for migraine.

## 2022-08-10 LAB — LIPID PANEL
Cholesterol: 138 mg/dL (ref 0–200)
HDL: 35.8 mg/dL — ABNORMAL LOW (ref >39.00)
LDL Cholesterol: 92 mg/dL (ref 0–99)
NonHDL: 102.52
Total CHOL/HDL Ratio: 4
Triglycerides: 51 mg/dL (ref 0.0–149.0)
VLDL: 10.2 mg/dL (ref 0.0–40.0)

## 2022-08-10 LAB — COMPREHENSIVE METABOLIC PANEL
ALT: 30 U/L (ref 0–35)
AST: 23 U/L (ref 0–37)
Albumin: 4.3 g/dL (ref 3.5–5.2)
Alkaline Phosphatase: 58 U/L (ref 39–117)
BUN: 12 mg/dL (ref 6–23)
CO2: 25 mEq/L (ref 19–32)
Calcium: 8.9 mg/dL (ref 8.4–10.5)
Chloride: 106 mEq/L (ref 96–112)
Creatinine, Ser: 0.85 mg/dL (ref 0.40–1.20)
GFR: 98.16 mL/min (ref >60.00)
Glucose, Bld: 86 mg/dL (ref 70–99)
Potassium: 3.9 mEq/L (ref 3.5–5.1)
Sodium: 139 mEq/L (ref 135–145)
Total Bilirubin: 0.5 mg/dL (ref 0.2–1.2)
Total Protein: 7 g/dL (ref 6.0–8.3)

## 2022-08-10 LAB — CBC WITH DIFFERENTIAL/PLATELET
Basophils Absolute: 0 10*3/uL (ref 0.0–0.1)
Basophils Relative: 0.7 % (ref 0.0–3.0)
Eosinophils Absolute: 0 10*3/uL (ref 0.0–0.7)
Eosinophils Relative: 0.4 % (ref 0.0–5.0)
HCT: 40.1 % (ref 36.0–46.0)
Hemoglobin: 13.2 g/dL (ref 12.0–15.0)
Lymphocytes Relative: 66.3 % — ABNORMAL HIGH (ref 12.0–46.0)
Lymphs Abs: 2.4 10*3/uL (ref 0.7–4.0)
MCHC: 32.9 g/dL (ref 30.0–36.0)
MCV: 86 fl (ref 78.0–100.0)
Monocytes Absolute: 0.3 10*3/uL (ref 0.1–1.0)
Monocytes Relative: 8.2 % (ref 3.0–12.0)
Neutro Abs: 0.9 10*3/uL — ABNORMAL LOW (ref 1.4–7.7)
Neutrophils Relative %: 24.4 % — ABNORMAL LOW (ref 43.0–77.0)
Platelets: 150 10*3/uL (ref 150.0–400.0)
RBC: 4.66 Mil/uL (ref 3.87–5.11)
RDW: 13.3 % (ref 11.5–15.5)
WBC: 3.6 10*3/uL — ABNORMAL LOW (ref 4.0–10.5)

## 2022-08-10 NOTE — Telephone Encounter (Signed)
error 

## 2022-08-11 ENCOUNTER — Telehealth: Payer: Self-pay

## 2022-08-11 NOTE — Telephone Encounter (Signed)
-----  Message from Lucillie Garfinkel, [place] sent at 08/10/2022  2:53 PM EST -----  ----- Message ----- From: Charyl Dancer, NP Sent: 08/10/2022   2:18 PM EST To: Lucillie Garfinkel, CMA  Needs lab visit in 4 weeks

## 2022-08-11 NOTE — Telephone Encounter (Signed)
I left a message for the patient to return my call.

## 2022-09-09 ENCOUNTER — Other Ambulatory Visit: Payer: 59

## 2022-09-16 ENCOUNTER — Encounter: Payer: Self-pay | Admitting: Nurse Practitioner

## 2022-09-16 ENCOUNTER — Ambulatory Visit (INDEPENDENT_AMBULATORY_CARE_PROVIDER_SITE_OTHER): Payer: 59 | Admitting: Nurse Practitioner

## 2022-09-16 VITALS — BP 118/64 | HR 117 | Temp 98.6°F | Ht 61.0 in | Wt 197.6 lb

## 2022-09-16 DIAGNOSIS — J029 Acute pharyngitis, unspecified: Secondary | ICD-10-CM | POA: Diagnosis not present

## 2022-09-16 DIAGNOSIS — J101 Influenza due to other identified influenza virus with other respiratory manifestations: Secondary | ICD-10-CM

## 2022-09-16 LAB — POCT INFLUENZA A/B
Influenza A, POC: NEGATIVE
Influenza B, POC: POSITIVE — AB

## 2022-09-16 LAB — POC COVID19 BINAXNOW: SARS Coronavirus 2 Ag: NEGATIVE

## 2022-09-16 LAB — POCT RAPID STREP A (OFFICE): Rapid Strep A Screen: NEGATIVE

## 2022-09-16 NOTE — Progress Notes (Signed)
Acute Office Visit  Subjective:     Patient ID: Laura [name], female    DOB: 06-09-2001, 22 y.o.   MRN: [id number]  Chief Complaint  Patient presents with   Sore Throat    With runny nose, cough, headache, fever last night    HPI:  Patient is in today for runny nose, cough, and headache for 4 days day.   UPPER RESPIRATORY TRACT INFECTION  Fever: yes Cough: yes Shortness of breath: no Wheezing: no Chest pain: no Chest tightness: no Chest congestion: no Nasal congestion: yes Runny nose: yes Post nasal drip: no Sneezing: yes Sore throat: yes Swollen glands: no Sinus pressure: yes Headache: yes Face pain: no Toothache: no Ear pain: yes bilateral Ear pressure: no bilateral Eyes red/itching:no Eye drainage/crusting: no  Vomiting: no Rash: no Fatigue: yes Sick contacts: no Strep contacts: no  Context: worse Recurrent sinusitis: no Relief with OTC cold/cough medications: no  Treatments attempted: arthritis cream, tylenol, theraflu, dayquil  ROS See pertinent positives and negatives per HPI.     Objective:    BP 118/64 (BP Location: Left Arm)   Pulse (!) 117   Temp 98.6 F (37 C)   Ht 5' 1"$  (1.549 m)   Wt 197 lb 9.6 oz (89.6 kg)   SpO2 97%   BMI 37.34 kg/m    Physical Exam Vitals and nursing note reviewed.  Constitutional:      General: She is not in acute distress.    Appearance: Normal appearance.  HENT:     Head: Normocephalic.     Right Ear: Tympanic membrane, ear canal and external ear normal.     Left Ear: Tympanic membrane, ear canal and external ear normal.     Nose:     Right Sinus: No maxillary sinus tenderness or frontal sinus tenderness.     Left Sinus: No maxillary sinus tenderness or frontal sinus tenderness.     Mouth/Throat:     Pharynx: Posterior oropharyngeal erythema present. No oropharyngeal exudate.  Eyes:     Conjunctiva/sclera: Conjunctivae normal.  Cardiovascular:     Rate and Rhythm: Normal rate and regular  rhythm.     Pulses: Normal pulses.     Heart sounds: Normal heart sounds.  Pulmonary:     Effort: Pulmonary effort is normal.     Breath sounds: Normal breath sounds.  Musculoskeletal:     Cervical back: Normal range of motion and neck supple. No tenderness.  Lymphadenopathy:     Cervical: No cervical adenopathy.  Skin:    General: Skin is warm.  Neurological:     General: No focal deficit present.     Mental Status: She is alert and oriented to person, place, and time.  Psychiatric:        Mood and Affect: Mood normal.        Behavior: Behavior normal.        Thought Content: Thought content normal.        Judgment: Judgment normal.     Results for orders placed or performed in visit on 09/16/22  POC COVID-19 BinaxNow  Result Value Ref Range   SARS Coronavirus 2 Ag Negative Negative  POCT rapid strep A  Result Value Ref Range   Rapid Strep A Screen Negative Negative  POCT Influenza A/B  Result Value Ref Range   Influenza A, POC Negative Negative   Influenza B, POC Positive (A) Negative        Assessment & Plan:   Problem List  Items Addressed This Visit   None Visit Diagnoses     Influenza B    -  Primary   Outside window for tamiflu. Encourage fluids, rest. Can alternate tylenol and ibuprofen for fever and body aches. Can use afrin x3 days for congestion.   Sore throat       POC Covid and strep negative. Posititve flu B. Can gargle with warm salt water prn sore throat. Alternate tylenol/ibuprofen. F/U if not improving.   Relevant Orders   POC COVID-19 BinaxNow (Completed)   POCT rapid strep A (Completed)   POCT Influenza A/B (Completed)       No orders of the defined types were placed in this encounter.   Return if symptoms worsen or fail to improve.  Charyl Dancer, NP

## 2022-09-16 NOTE — Patient Instructions (Signed)
It was great to see you!  Drink plenty of fluids, add in gatorade once a day.   Alternate tylenol and ibuprofen every 3 hours.   You can afrin nasal spray at bedtime for 3 nights.   Let's follow-up if your symptoms worsen or don't improve.   Take care,  Vance Peper, NP

## 2022-09-20 ENCOUNTER — Other Ambulatory Visit: Payer: Self-pay | Admitting: Nurse Practitioner

## 2022-09-20 NOTE — Telephone Encounter (Signed)
Caller Name: Tamura Call back phone #: [phone]  Reason for Call: Pt requesting refill on imitrex due to migraines. She's had this since last Friday. She is back at work today. Nausea along with the migraine.    [id number] [place] - [address]

## 2022-09-21 MED ORDER — SUMATRIPTAN SUCCINATE 25 MG PO TABS: 25.0000 mg | ORAL_TABLET | ORAL | 0 refills | Status: DC | PRN

## 2022-09-21 NOTE — Telephone Encounter (Signed)
I called patient notified her that Rx sent in.

## 2022-10-19 ENCOUNTER — Other Ambulatory Visit: Payer: Self-pay | Admitting: Nurse Practitioner

## 2022-10-20 NOTE — Telephone Encounter (Signed)
Refill request for pending Rx last OV 09/16/22 last RF 09/21/22. Please advise

## 2022-11-04 ENCOUNTER — Ambulatory Visit (INDEPENDENT_AMBULATORY_CARE_PROVIDER_SITE_OTHER): Payer: 59

## 2022-11-04 DIAGNOSIS — Z3009 Encounter for other general counseling and advice on contraception: Secondary | ICD-10-CM

## 2022-11-04 LAB — POCT URINE PREGNANCY: Preg Test, Ur: NEGATIVE

## 2022-11-04 MED ORDER — MEDROXYPROGESTERONE ACETATE 150 MG/ML IM SUSP
150.0000 mg | INTRAMUSCULAR | Status: DC
  Administered 2022-11-04: 150 mg via INTRAMUSCULAR

## 2022-11-04 NOTE — Progress Notes (Signed)
Per orders of Rodman Pickle pt is here for Depo Provera Injection. PT received Depo Provera Injection in Depo. Given by [place].. Pt tolerated Depo Provera Injection well.  Pt will return in 3 mo's between January 20, 2023 and February 17, 2023 for next Depo Provera injection

## 2022-12-28 ENCOUNTER — Other Ambulatory Visit: Payer: Self-pay | Admitting: Nurse Practitioner

## 2022-12-28 MED ORDER — SUMATRIPTAN SUCCINATE 25 MG PO TABS
ORAL_TABLET | ORAL | 1 refills | Status: DC
Start: 2022-12-28 — End: 2023-05-04

## 2022-12-28 NOTE — Telephone Encounter (Signed)
Requesting: SUMAtriptan (IMITREX) 25 MG tablet  Last Visit: 09/16/2022 Next Visit: Visit date not found Last Refill: 10/20/2022  Please Advise

## 2022-12-28 NOTE — Telephone Encounter (Signed)
Patient notified that Rx refilled.

## 2023-03-03 ENCOUNTER — Ambulatory Visit (INDEPENDENT_AMBULATORY_CARE_PROVIDER_SITE_OTHER): Payer: 59 | Admitting: Nurse Practitioner

## 2023-03-03 ENCOUNTER — Encounter: Payer: Self-pay | Admitting: Nurse Practitioner

## 2023-03-03 VITALS — BP 122/70 | HR 63 | Temp 97.3°F | Ht 61.0 in | Wt 208.0 lb

## 2023-03-03 DIAGNOSIS — R309 Painful micturition, unspecified: Secondary | ICD-10-CM | POA: Diagnosis not present

## 2023-03-03 DIAGNOSIS — N3000 Acute cystitis without hematuria: Secondary | ICD-10-CM

## 2023-03-03 DIAGNOSIS — Z30011 Encounter for initial prescription of contraceptive pills: Secondary | ICD-10-CM | POA: Diagnosis not present

## 2023-03-03 DIAGNOSIS — Z3009 Encounter for other general counseling and advice on contraception: Secondary | ICD-10-CM

## 2023-03-03 DIAGNOSIS — N912 Amenorrhea, unspecified: Secondary | ICD-10-CM | POA: Diagnosis not present

## 2023-03-03 LAB — POC URINALSYSI DIPSTICK (AUTOMATED)
Bilirubin, UA: NEGATIVE
Blood, UA: 10
Glucose, UA: NEGATIVE
Ketones, UA: NEGATIVE
Nitrite, UA: NEGATIVE
Protein, UA: NEGATIVE
Spec Grav, UA: 1.025 (ref 1.010–1.025)
Urobilinogen, UA: 0.2 E.U./dL
pH, UA: 5.5 (ref 5.0–8.0)

## 2023-03-03 LAB — POCT URINE PREGNANCY: Preg Test, Ur: NEGATIVE

## 2023-03-03 MED ORDER — NORELGESTROMIN-ETH ESTRADIOL 150-35 MCG/24HR TD PTWK
1.0000 | MEDICATED_PATCH | TRANSDERMAL | 3 refills | Status: DC
  Filled 2023-03-25: qty 9, 63d supply, fill #0

## 2023-03-03 MED ORDER — NITROFURANTOIN MONOHYD MACRO 100 MG PO CAPS: 100.0000 mg | ORAL_CAPSULE | Freq: Two times a day (BID) | ORAL | 0 refills | Status: DC

## 2023-03-03 NOTE — Patient Instructions (Signed)
It was great to see you!  Start macrobid twice a day for 5 days. Drink plenty of water  Start the xulane birth control patch. You put one patch on each week, take it off before you put on a new one. Change the patches for a total of 3 weeks, then leave it off for a week.   Let's follow-up in 5 months, sooner if you have concerns.  If a referral was placed today, you will be contacted for an appointment. Please note that routine referrals can sometimes take up to 3-4 weeks to process. Please call our office if you haven't heard anything after this time frame.  Take care,  Rodman Pickle, NP

## 2023-03-03 NOTE — Progress Notes (Signed)
Acute Office Visit  Subjective:     Patient ID: Laura [name], female    DOB: Jul 26, 2001, 22 y.o.   MRN: [id number]  Chief Complaint  Patient presents with   Dysuria    For 2 weeks, discuss changing birth control medication   HPI  Patient is in today for dysuria and wanting to change birth control medications. She states that she has been on depo provera for 5 years and would like to switch.   URINARY SYMPTOMS  Dysuria: yes Urinary frequency: yes Urgency: yes Small volume voids:  sometimes Urinary incontinence: no Foul odor: no Hematuria: no Abdominal pain: no Back pain: yes Suprapubic pain/pressure: no Flank pain: no Fever:  no Vomiting: no Relief with cranberry juice:  n/a Relief with pyridium: no Status: stable Previous urinary tract infection: yes Recurrent urinary tract infection: no Treatments attempted:  azo and increasing fluids   ROS See pertinent positives and negatives per HPI.     Objective:    BP 122/70 (BP Location: Left Arm)   Pulse 63   Temp (!) 97.3 F (36.3 C)   Ht 5\' 1"  (1.549 m)   Wt 208 lb (94.3 kg)   SpO2 99%   BMI 39.30 kg/m    Physical Exam Vitals and nursing note reviewed.  Constitutional:      General: She is not in acute distress.    Appearance: Normal appearance.  HENT:     Head: Normocephalic.  Eyes:     Conjunctiva/sclera: Conjunctivae normal.  Cardiovascular:     Rate and Rhythm: Normal rate and regular rhythm.     Pulses: Normal pulses.     Heart sounds: Normal heart sounds.  Pulmonary:     Effort: Pulmonary effort is normal.     Breath sounds: Normal breath sounds.  Abdominal:     Palpations: Abdomen is soft.     Tenderness: There is no abdominal tenderness. There is no right CVA tenderness or left CVA tenderness.  Musculoskeletal:     Cervical back: Normal range of motion.  Skin:    General: Skin is warm.  Neurological:     General: No focal deficit present.     Mental Status: She is alert and  oriented to person, place, and time.  Psychiatric:        Mood and Affect: Mood normal.        Behavior: Behavior normal.        Thought Content: Thought content normal.        Judgment: Judgment normal.     Results for orders placed or performed in visit on 03/03/23  POCT Urinalysis Dipstick (Automated)  Result Value Ref Range   Color, UA     Clarity, UA     Glucose, UA Negative Negative   Bilirubin, UA Negative    Ketones, UA Negative    Spec Grav, UA 1.025 1.010 - 1.025   Blood, UA 10 Ery    pH, UA 5.5 5.0 - 8.0   Protein, UA Negative Negative   Urobilinogen, UA 0.2 0.2 or 1.0 E.U./dL   Nitrite, UA Negative    Leukocytes, UA Moderate (2+) (A) Negative  POCT urine pregnancy  Result Value Ref Range   Preg Test, Ur Negative Negative        Assessment & Plan:   Problem List Items Addressed This Visit       Other   Birth control counseling    She is interested in switching from depo provera injection which  she has been on for 5 years to the patch. Will start xulane patch once a week for 3 weeks, then leave off for a week. Follow-up with any concerns.       Other Visit Diagnoses     Acute cystitis without hematuria    -  Primary   Treat with macrobid BID x 5 days. Encourage fluids. Will add urine culture. F/U if not improving   Pain with urination       U/A positive for 2+ leukocytes. WIth symptoms will treat for UTI. See above.   Relevant Orders   POCT Urinalysis Dipstick (Automated) (Completed)   Urine Culture   Absence of menses due to use of contraceptive       Urine pregnancy negative.   Relevant Orders   POCT urine pregnancy (Completed)       Meds ordered this encounter  Medications   nitrofurantoin, macrocrystal-monohydrate, (MACROBID) 100 MG capsule    Sig: Take 1 capsule (100 mg total) by mouth 2 (two) times daily.    Dispense:  10 capsule    Refill:  0   norelgestromin-ethinyl estradiol [employer]ico) 150-35 MCG/24HR transdermal patch    Sig: Place 1  patch onto the skin once a week.    Dispense:  9 patch    Refill:  3    Return in about 5 months (around 08/03/2023) for CPE.  Gerre Scull, NP

## 2023-03-03 NOTE — Assessment & Plan Note (Signed)
She is interested in switching from depo provera injection which she has been on for 5 years to the patch. Will start xulane patch once a week for 3 weeks, then leave off for a week. Follow-up with any concerns.

## 2023-03-21 ENCOUNTER — Encounter: Payer: Self-pay | Admitting: Nurse Practitioner

## 2023-03-24 ENCOUNTER — Encounter: Payer: Self-pay | Admitting: Internal Medicine

## 2023-03-24 ENCOUNTER — Other Ambulatory Visit (HOSPITAL_COMMUNITY)
Admission: RE | Admit: 2023-03-24 | Discharge: 2023-03-24 | Disposition: A | Discharge status: Home / Self Care | Payer: 59 | Source: Ambulatory Visit | Attending: Internal Medicine | Admitting: Internal Medicine

## 2023-03-24 ENCOUNTER — Ambulatory Visit (INDEPENDENT_AMBULATORY_CARE_PROVIDER_SITE_OTHER): Payer: 59 | Admitting: Internal Medicine

## 2023-03-24 VITALS — BP 120/80 | HR 80 | Temp 98.6°F | Ht 61.0 in | Wt 211.6 lb

## 2023-03-24 DIAGNOSIS — U071 COVID-19: Secondary | ICD-10-CM | POA: Diagnosis not present

## 2023-03-24 DIAGNOSIS — N898 Other specified noninflammatory disorders of vagina: Secondary | ICD-10-CM | POA: Insufficient documentation

## 2023-03-24 LAB — POC URINALSYSI DIPSTICK (AUTOMATED)
Bilirubin, UA: NEGATIVE
Blood, UA: NEGATIVE
Glucose, UA: NEGATIVE
Ketones, UA: NEGATIVE
Leukocytes, UA: NEGATIVE
Nitrite, UA: NEGATIVE
Protein, UA: NEGATIVE
Spec Grav, UA: 1.01 (ref 1.010–1.025)
Urobilinogen, UA: 0.2 E.U./dL
pH, UA: 6 (ref 5.0–8.0)

## 2023-03-24 LAB — POCT INFLUENZA A/B
Influenza A, POC: NEGATIVE
Influenza B, POC: NEGATIVE

## 2023-03-24 LAB — POC COVID19 BINAXNOW: SARS Coronavirus 2 Ag: POSITIVE — AB

## 2023-03-24 LAB — POCT RAPID STREP A (OFFICE): Rapid Strep A Screen: NEGATIVE

## 2023-03-24 LAB — POCT URINE PREGNANCY: Preg Test, Ur: NEGATIVE

## 2023-03-24 MED ORDER — NIRMATRELVIR/RITONAVIR (PAXLOVID)TABLET: 3.0000 | ORAL_TABLET | Freq: Two times a day (BID) | ORAL | 0 refills | Status: AC

## 2023-03-24 MED ORDER — FLUCONAZOLE 150 MG PO TABS: ORAL_TABLET | ORAL | 0 refills | Status: DC

## 2023-03-24 NOTE — Patient Instructions (Addendum)
I will reach out to you with your lab results    COVID-19 positive:   Vitamin Regimen:  Vitamin C 500mg  twice daily  Vitamin D 5000 units once daily  Zinc 50-75mg  once daily   Over the counter Medications:  Aspirin 81mg  per day * unless allergic or contraindicated*  Use Tylenol (acetaminophen) for fever *unless allergic or contraindicated*    Non-Medication Therapy:  Drink plenty of fluids, warm if possible.  A teaspoon of honey may help ease coughing symptoms.  Cough drops or hard candy for coughing.   Over the Counter Medication Therapy:  Use a cough expectorant such as guaifenesin (Mucinex) if recommended by your doctor for a wet, congested cough. If you have high blood pressure, please ask your doctor first before using this.  Use a cough suppressant such as dextromethorphan (Robitussin/Delsym) for a dry cough. If you have high blood pressure, please ask your doctor first before using this.  If you have high blood pressure, medication such as Coricidin HBP is safe to take for your cough and will not increase your blood pressure.    Stay home and away from others until symptoms are improving and fever free for 24 hours without the use of tylenol or ibuprofen. If symptoms have improved and fever resolved, then can be in public setting, but should wear a mask around others, practice safe distancing, and perform hand hygiene for an additional 5 days.  If symptoms worsen, or the patient develop shortness of breath, pulse oximeter reading of < 90%, or chest pain, seek immediate care at nearest emergency room or call 911.

## 2023-03-24 NOTE — Progress Notes (Signed)
[facility] [address] [place] [address] Dept: [phone] Dept Fax: [phone]  Acute Care Office Visit  Subjective:   Laura [name] 2000/12/08 03/24/2023  Chief Complaint  Patient presents with   Vaginal Itching    Earache, coughing sneezing started monday    HPI:  URINARY SYMPTOMS Onset: 4 days ago . Patient was tx'd with macrobid for UTI on 03/03/23. Urine culture did not show definitive bacterial organism, just mixed genital flora. Patient completed antibiotic as instructed. Now have vaginal itching and irritation. Patient states she got a [place] wax last Friday.   Dysuria: no Urinary frequency: no Urgency: no Foul odor: no Hematuria: no Abdominal pain: no Suprapubic pain/pressure: no Vaginal discharge: yes - thin mucus Flank/low back pain: no Nausea/Vomiting: nausea  Previous urinary tract infection: yes Sexually active: 1 female partner  , does not use protection all the time  History of sexually transmitted disease: yes - Chlamydia and Gonorrhea   URI SYMPTOMS: Onset: 4 days ago   Fever: patient not sure, is having chills Runny nose: no Headaches: yes  Sneezing: yes  Itching/watery eyes: yes Nasal congestion: no  Cough: yes - nonproductive  Ear pain: yes- bilateral  Sore throat: yes - initially, but has now resolved.   Treatments tried: Imitrex with little relief of headache, Mucinex , Tylenol   Recent sick contacts: no known recent sick contacts     The following portions of the patient's history were reviewed and updated as appropriate: past medical history, past surgical history, family history, social history, allergies, medications, and problem list.   Patient Active Problem List   Diagnosis Date Noted   Birth control counseling 07/08/2022   Migraine without aura and without status migrainosus, not intractable 07/08/2022   Past Medical History:  Diagnosis Date   Migraines     History reviewed. No pertinent surgical history. Family History  Problem Relation Age of Onset   Hypertension Mother     Current Outpatient Medications:    fluconazole (DIFLUCAN) 150 MG tablet, Take 1 tablet by mouth once. Repeat dose in 3 days if symptoms persist., Disp: 2 tablet, Rfl: 0   ibuprofen (ADVIL) 800 MG tablet, Take 1 tablet (800 mg total) by mouth 3 (three) times daily., Disp: 21 tablet, Rfl: 0   nirmatrelvir/ritonavir (PAXLOVID) 20 x 150 MG & 10 x 100MG  TABS, Take 3 tablets by mouth 2 (two) times daily for 5 days. (Take nirmatrelvir 150 mg two tablets twice daily for 5 days and ritonavir 100 mg one tablet twice daily for 5 days), Disp: 30 tablet, Rfl: 0   norelgestromin-ethinyl estradiol (XULANE) 150-35 MCG/24HR transdermal patch, Place 1 patch onto the skin once a week., Disp: 9 patch, Rfl: 3   SUMAtriptan (IMITREX) 25 MG tablet, Take 1 tablet as needed for migraine. May repeat in 2 hours if headache persists or recurs., Disp: 10 tablet, Rfl: 1 No Known Allergies   ROS: A complete ROS was performed with pertinent positives/negatives noted in the HPI. The remainder of the ROS are negative.    Objective:   Today's Vitals   03/24/23 1318  BP: 120/80  Pulse: 80  Temp: 98.6 F (37 C)  TempSrc: Temporal  SpO2: 95%  Weight: 211 lb 9.6 oz (96 kg)  Height: 5\' 1"  (1.549 m)    GENERAL: Well-appearing, in NAD. Well nourished.  SKIN: Pink, warm and dry. No rash, lesion, ulceration, or ecchymoses.  HEENT:    HEAD: Normocephalic, non-traumatic.  EYES: Conjunctive  pink without exudate. PERRL, EOMI.  EARS: External ear w/o redness, swelling, masses, or lesions. EAC clear. TM's intact, translucent w/o bulging, appropriate landmarks visualized.  NOSE: Septum midline w/o deformity. Nares patent, mucosa pink and non-inflamed w/o drainage. No sinus tenderness.  THROAT: Uvula midline. Oropharynx slightly erythematous. Tonsils non-inflamed w/o exudate. Mucus membranes pink and  moist.  NECK: Trachea midline. Full ROM w/o pain or tenderness. No lymphadenopathy.  RESPIRATORY: Chest wall symmetrical. Respirations even and non-labored. Breath sounds clear to auscultation bilaterally. (+)cough CARDIAC: S1, S2 present, regular rate and rhythm. Peripheral pulses 2+ bilaterally.  EXTREMITIES: Without clubbing, cyanosis, or edema.  PSYCH/MENTAL STATUS: Alert, oriented x 3. Cooperative, appropriate mood and affect.    Results for orders placed or performed in visit on 03/24/23  POCT Urinalysis Dipstick (Automated)  Result Value Ref Range   Color, UA yellow    Clarity, UA clear    Glucose, UA Negative Negative   Bilirubin, UA neg    Ketones, UA neg    Spec Grav, UA 1.010 1.010 - 1.025   Blood, UA neg    pH, UA 6.0 5.0 - 8.0   Protein, UA Negative Negative   Urobilinogen, UA 0.2 0.2 or 1.0 E.U./dL   Nitrite, UA neg    Leukocytes, UA Negative Negative  POC COVID-19 BinaxNow  Result Value Ref Range   SARS Coronavirus 2 Ag Positive (A) Negative  POCT Influenza A/B  Result Value Ref Range   Influenza A, POC Negative Negative   Influenza B, POC Negative Negative  POCT rapid strep A  Result Value Ref Range   Rapid Strep A Screen Negative Negative  POCT urine pregnancy  Result Value Ref Range   Preg Test, Ur Negative Negative      Assessment & Plan:  1. Vaginal itching - POCT Urinalysis Dipstick (Automated) - Cervicovaginal ancillary only - POCT urine pregnancy - fluconazole (DIFLUCAN) 150 MG tablet; Take 1 tablet by mouth once. Repeat dose in 3 days if symptoms persist.  Dispense: 2 tablet; Refill: 0  2. COVID-19 - POC COVID-19 BinaxNow - POCT Influenza A/B - POCT rapid strep A - nirmatrelvir/ritonavir (PAXLOVID) 20 x 150 MG & 10 x 100MG  TABS; Take 3 tablets by mouth 2 (two) times daily for 5 days. (Take nirmatrelvir 150 mg two tablets twice daily for 5 days and ritonavir 100 mg one tablet twice daily for 5 days)  Dispense: 30 tablet; Refill: 0  Vitamin  Regimen:  Vitamin C 500mg  twice daily  Vitamin D 5000 units once daily  Zinc 50-75mg  once daily   Over the counter Medications:  Aspirin 81mg  per day * unless allergic or contraindicated*  Use Tylenol (acetaminophen) for fever *unless allergic or contraindicated*    Non-Medication Therapy:  Drink plenty of fluids, warm if possible.  A teaspoon of honey may help ease coughing symptoms.  Cough drops or hard candy for coughing.   Over the Counter Medication Therapy:  Use a cough expectorant such as guaifenesin (Mucinex) if recommended by your doctor for a wet, congested cough. If you have high blood pressure, please ask your doctor first before using this.  Use a cough suppressant such as dextromethorphan (Robitussin/Delsym) for a dry cough. If you have high blood pressure, please ask your doctor first before using this.  If you have high blood pressure, medication such as Coricidin HBP is safe to take for your cough and will not increase your blood pressure.    Stay home and away from others until symptoms are improving  and fever free for 24 hours without the use of tylenol or ibuprofen. If symptoms have improved and fever resolved, then can be in public setting, but should wear a mask around others, practice safe distancing, and perform hand hygiene for an additional 5 days.  If symptoms worsen, or the patient develop shortness of breath, pulse oximeter reading of < 90%, or chest pain, seek immediate care at nearest emergency room or call 911.    Meds ordered this encounter  Medications   fluconazole (DIFLUCAN) 150 MG tablet    Sig: Take 1 tablet by mouth once. Repeat dose in 3 days if symptoms persist.    Dispense:  2 tablet    Refill:  0    Order Specific Question:   Supervising Provider    Answer:   Garnette Gunner [[id number]]   nirmatrelvir/ritonavir (PAXLOVID) 20 x 150 MG & 10 x 100MG  TABS    Sig: Take 3 tablets by mouth 2 (two) times daily for 5 days. (Take nirmatrelvir 150 mg  two tablets twice daily for 5 days and ritonavir 100 mg one tablet twice daily for 5 days)    Dispense:  30 tablet    Refill:  0    Order Specific Question:   Supervising Provider    Answer:   Garnette Gunner [[id number]]   Orders Placed This Encounter  Procedures   POCT Urinalysis Dipstick (Automated)   POC COVID-19 BinaxNow    Order Specific Question:   Previously tested for COVID-19    Answer:   Yes    Order Specific Question:   Resident in a congregate (group) care setting    Answer:   Unknown    Order Specific Question:   Employed in healthcare setting    Answer:   Unknown    Order Specific Question:   Pregnant    Answer:   Unknown   POCT Influenza A/B   POCT rapid strep A   POCT urine pregnancy   Lab Orders         POCT Urinalysis Dipstick (Automated)         POC COVID-19 BinaxNow         POCT Influenza A/B         POCT rapid strep A         POCT urine pregnancy     No images are attached to the encounter or orders placed in the encounter.  Return if symptoms worsen or fail to improve.   Salvatore Decent, FNP

## 2023-03-25 ENCOUNTER — Other Ambulatory Visit (HOSPITAL_COMMUNITY): Payer: Self-pay

## 2023-03-27 ENCOUNTER — Ambulatory Visit
Admission: EM | Admit: 2023-03-27 | Discharge: 2023-03-27 | Disposition: A | Discharge status: Home / Self Care | Payer: 59

## 2023-03-27 DIAGNOSIS — U071 COVID-19: Secondary | ICD-10-CM | POA: Diagnosis not present

## 2023-03-27 NOTE — ED Provider Notes (Signed)
[name]    CSN: [id number] Arrival date & time: 03/27/23  1325      History   Chief Complaint Chief Complaint  Patient presents with   COVID19     Discuss symptoms and Note.    HPI Laura [name] is a 22 y.o. female.   Patient here today for evaluation of COVID-19.  She states that she has had some headaches and nausea since taking Paxlovid.  She is on day 4 of medication.  She has had symptoms of COVID for about a week.  She requests note for work given side effects from [employer].  She has not had additional fever.  Overall symptoms seem to be improving.  The history is provided by the patient.    Past Medical History:  Diagnosis Date   Migraines     Patient Active Problem List   Diagnosis Date Noted   Birth control counseling 07/08/2022   Migraine without aura and without status migrainosus, not intractable 07/08/2022    History reviewed. No pertinent surgical history.  OB History   No obstetric history on file.      Home Medications    Prior to Admission medications   Medication Sig Start Date End Date Taking? Authorizing Provider  nirmatrelvir/ritonavir (PAXLOVID) 20 x 150 MG & 10 x 100MG  TABS Take 3 tablets by mouth 2 (two) times daily for 5 days. (Take nirmatrelvir 150 mg two tablets twice daily for 5 days and ritonavir 100 mg one tablet twice daily for 5 days) 03/24/23 03/29/23 Yes Salvatore Decent, FNP  norelgestromin-ethinyl estradiol Burr Medico) 150-35 MCG/24HR transdermal patch Place 1 patch onto the skin once a week. 03/03/23  Yes McElwee, Lauren A, NP  fluconazole (DIFLUCAN) 150 MG tablet Take 1 tablet by mouth once. Repeat dose in 3 days if symptoms persist. 03/24/23   Salvatore Decent, FNP  ibuprofen (ADVIL) 800 MG tablet Take 1 tablet (800 mg total) by mouth 3 (three) times daily. 09/09/20   Wieters, Hallie C, PA-C  SUMAtriptan (IMITREX) 25 MG tablet Take 1 tablet as needed for migraine. May repeat in 2 hours if headache persists or recurs. 12/28/22    McElwee, Jake Church, NP    Family History Family History  Problem Relation Age of Onset   Hypertension Mother     Social History Social History   Tobacco Use   Smoking status: Former    Types: Cigarettes   Smokeless tobacco: Never  Vaping Use   Vaping status: Never Used  Substance Use Topics   Alcohol use: Yes    Alcohol/week: 2.0 standard drinks of alcohol    Types: 2 Shots of liquor per week    Comment: Occassionally.   Drug use: Yes    Types: Marijuana    Comment: twice a week     Allergies   Patient has no known allergies.   Review of Systems Review of Systems  Constitutional:  Negative for chills and fever.  HENT:  Positive for congestion. Negative for sore throat.   Eyes:  Negative for discharge and redness.  Respiratory:  Negative for cough and shortness of breath.   Gastrointestinal:  Positive for nausea. Negative for abdominal pain, diarrhea and vomiting.  Neurological:  Positive for headaches.     Physical Exam Triage Vital Signs ED Triage Vitals  Encounter Vitals Group     BP 03/27/23 1355 98/73     Systolic BP Percentile --      Diastolic BP Percentile --  Pulse Rate 03/27/23 1355 92     Resp 03/27/23 1355 18     Temp 03/27/23 1355 98 F (36.7 C)     Temp Source 03/27/23 1354 Oral     SpO2 03/27/23 1355 97 %     Weight 03/27/23 1352 210 lb (95.3 kg)     Height 03/27/23 1352 5\' 2"  (1.575 m)     Head Circumference --      Peak Flow --      Pain Score 03/27/23 1346 6     Pain Loc --      Pain Education --      Exclude from Growth Chart --    No data found.  Updated Vital Signs BP 98/73 (BP Location: Right Arm)   Pulse 92   Temp 98 F (36.7 C) (Oral)   Resp 18   Ht 5\' 2"  (1.575 m)   Wt 210 lb (95.3 kg)   LMP  (LMP Unknown) Comment: Patch  SpO2 97%   BMI 38.41 kg/m      Physical Exam Vitals and nursing note reviewed.  Constitutional:      General: She is not in acute distress.    Appearance: Normal appearance. She is not  ill-appearing.  HENT:     Head: Normocephalic and atraumatic.     Nose: Nose normal. No congestion or rhinorrhea.  Eyes:     Conjunctiva/sclera: Conjunctivae normal.  Cardiovascular:     Rate and Rhythm: Normal rate and regular rhythm.     Heart sounds: Normal heart sounds.  Pulmonary:     Effort: Pulmonary effort is normal. No respiratory distress.     Breath sounds: No wheezing, rhonchi or rales.  Neurological:     Mental Status: She is alert.  Psychiatric:        Mood and Affect: Mood normal.        Behavior: Behavior normal.        Thought Content: Thought content normal.      UC Treatments / Results  Labs (all labs ordered are listed, but only abnormal results are displayed) Labs Reviewed - No data to display  EKG   Radiology No results found.  Procedures Procedures (including critical care time)  Medications Ordered in UC Medications - No data to display  Initial Impression / Assessment and Plan / UC Course  I have reviewed the triage vital signs and the nursing notes.  Pertinent labs & imaging results that were available during my care of the patient were reviewed by me and considered in my medical decision making (see chart for details).    Suspected side effects from Paxlovid and no lingering symptoms from COVID recommended she discontinue Paxlovid.  Work note provided as requested.  Encouraged symptomatic treatment as needed.  Recommend follow-up if no gradual improvement with any further concerns.  Final Clinical Impressions(s) / UC Diagnoses   Final diagnoses:  COVID-19   Discharge Instructions   None    ED Prescriptions   None    PDMP not reviewed this encounter.   Tomi Bamberger, PA-C 03/27/23 [phone]

## 2023-03-27 NOTE — ED Triage Notes (Signed)
"  I went to my regular doctor on Thursday and they said I had COVID19". I would like to discuss my current remaining symptoms, the medication I was given and I need a note for work.

## 2023-03-29 ENCOUNTER — Other Ambulatory Visit (HOSPITAL_COMMUNITY): Payer: Self-pay

## 2023-03-31 ENCOUNTER — Other Ambulatory Visit: Payer: Self-pay | Admitting: Internal Medicine

## 2023-03-31 LAB — CERVICOVAGINAL ANCILLARY ONLY
Bacterial Vaginitis (gardnerella): NEGATIVE
Candida Glabrata: NEGATIVE
Candida Vaginitis: POSITIVE — AB
Chlamydia: NEGATIVE
Comment: NEGATIVE
Comment: NEGATIVE
Comment: NEGATIVE
Comment: NEGATIVE
Comment: NEGATIVE
Comment: NORMAL
Neisseria Gonorrhea: NEGATIVE
Trichomonas: NEGATIVE

## 2023-05-03 ENCOUNTER — Encounter: Payer: Self-pay | Admitting: Nurse Practitioner

## 2023-05-04 ENCOUNTER — Other Ambulatory Visit (HOSPITAL_COMMUNITY): Payer: Self-pay

## 2023-05-04 ENCOUNTER — Encounter: Payer: Self-pay | Admitting: Nurse Practitioner

## 2023-05-04 ENCOUNTER — Telehealth (INDEPENDENT_AMBULATORY_CARE_PROVIDER_SITE_OTHER): Payer: 59 | Admitting: Nurse Practitioner

## 2023-05-04 VITALS — Ht 62.0 in | Wt 210.0 lb

## 2023-05-04 DIAGNOSIS — Z3009 Encounter for other general counseling and advice on contraception: Secondary | ICD-10-CM

## 2023-05-04 MED ORDER — NORETHINDRONE ACET-ETHINYL EST 1.5-30 MG-MCG PO TABS
1.0000 | ORAL_TABLET | Freq: Every day | ORAL | 3 refills | Status: DC
  Filled 2023-05-04: qty 84, 84d supply, fill #0
  Filled 2023-08-15: qty 84, 84d supply, fill #1
  Filled 2023-11-24: qty 84, 84d supply, fill #2
  Filled 2024-02-14: qty 84, 84d supply, fill #3

## 2023-05-04 MED ORDER — SUMATRIPTAN SUCCINATE 25 MG PO TABS
ORAL_TABLET | ORAL | 1 refills | Status: DC
  Filled 2023-05-04: qty 18, 30d supply, fill #0

## 2023-05-04 NOTE — Assessment & Plan Note (Signed)
Will switch from the patch to the pill.  She states that the patch keeps falling off, especially with increasing exercise.  Will have her start Loestrin FE.  Discussed taking this at the same time every day.  Discussed possible side effects.  Follow-up with any concerns.

## 2023-05-04 NOTE — Patient Instructions (Signed)
Start Loestrin FE 1 pill daily.  You can start this as soon as you pick it up.  Take the patch off on the same day.

## 2023-05-04 NOTE — Progress Notes (Signed)
[facility] [address] [place] [address] Dept: [phone] Dept Fax: [phone]  Virtual Video Visit  I connected with Gwyneth Sprout on 05/04/23 at  4:20 PM EDT by a video enabled telemedicine application and verified that I am speaking with the correct person using two identifiers.  Location patient: Home Location provider: Clinic Persons participating in the virtual visit: Patient; Rodman Pickle, NP; Malena Peer, CMA  I discussed the limitations of evaluation and management by telemedicine and the availability of in person appointments. The patient expressed understanding and agreed to proceed.  Chief Complaint  Patient presents with   Medication Management    Discuss switching birth control medication    SUBJECTIVE:  HPI: EUFELIA VENO is a 22 y.o. female who presents to follow-up on birth control.  She is interested in switching from the patch to the pill.  She states that even with a Band-Aid, the patches falling off at times, especially now that she is exercising more and sweating.  She denies side effects to it.  She currently has a patch on now.  Her last menstrual was last week.  Patient Active Problem List   Diagnosis Date Noted   Birth control counseling 07/08/2022   Migraine without aura and without status migrainosus, not intractable 07/08/2022    No past surgical history on file.  Family History  Problem Relation Age of Onset   Hypertension Mother     Social History   Tobacco Use   Smoking status: Former    Types: Cigarettes   Smokeless tobacco: Never  Vaping Use   Vaping status: Never Used  Substance Use Topics   Alcohol use: Yes    Alcohol/week: 2.0 standard drinks of alcohol    Types: 2 Shots of liquor per week    Comment: Occassionally.   Drug use: Yes    Types: Marijuana    Comment: twice a week     Current Outpatient Medications:    Norethindrone  Acetate-Ethinyl Estradiol (LOESTRIN) 1.5-30 MG-MCG tablet, Take 1 tablet by mouth daily., Disp: 84 tablet, Rfl: 3   fluconazole (DIFLUCAN) 150 MG tablet, Take 1 tablet by mouth once. Repeat dose in 3 days if symptoms persist. (Patient not taking: Reported on 05/04/2023), Disp: 2 tablet, Rfl: 0   ibuprofen (ADVIL) 800 MG tablet, Take 1 tablet (800 mg total) by mouth 3 (three) times daily. (Patient not taking: Reported on 05/04/2023), Disp: 21 tablet, Rfl: 0   SUMAtriptan (IMITREX) 25 MG tablet, Take 1 tablet as needed for migraine. May repeat in 2 hours if headache persists or recurs., Disp: 10 tablet, Rfl: 1  No Known Allergies  ROS: See pertinent positives and negatives per HPI.  OBSERVATIONS/OBJECTIVE:  VITALS per patient if applicable: Today's Vitals   05/04/23 1627  Weight: 210 lb (95.3 kg)  Height: 5\' 2"  (1.575 m)   Body mass index is 38.41 kg/m.    GENERAL: Alert and oriented. Appears well and in no acute distress.  HEENT: Atraumatic. Conjunctiva clear. No obvious abnormalities on inspection of external nose and ears.  NECK: Normal movements of the head and neck.  LUNGS: On inspection, no signs of respiratory distress. Breathing rate appears normal. No obvious gross SOB, gasping or wheezing, and no conversational dyspnea.  CV: No obvious cyanosis.  MS: Moves all visible extremities without noticeable abnormality.  PSYCH/NEURO: Pleasant and cooperative. No obvious depression or anxiety. Speech and thought processing grossly intact.  ASSESSMENT AND PLAN:  Problem List  Items Addressed This Visit       Other   Birth control counseling - Primary    Will switch from the patch to the pill.  She states that the patch keeps falling off, especially with increasing exercise.  Will have her start Loestrin FE.  Discussed taking this at the same time every day.  Discussed possible side effects.  Follow-up with any concerns.        I discussed the assessment and treatment plan  with the patient. The patient was provided an opportunity to ask questions and all were answered. The patient agreed with the plan and demonstrated an understanding of the instructions.   The patient was advised to call back or seek an in-person evaluation if the symptoms worsen or if the condition fails to improve as anticipated.   Gerre Scull, NP

## 2023-05-05 ENCOUNTER — Other Ambulatory Visit (HOSPITAL_COMMUNITY): Payer: Self-pay

## 2023-07-04 ENCOUNTER — Encounter: Payer: Self-pay | Admitting: Nurse Practitioner

## 2023-07-04 ENCOUNTER — Ambulatory Visit (INDEPENDENT_AMBULATORY_CARE_PROVIDER_SITE_OTHER): Payer: 59 | Admitting: Nurse Practitioner

## 2023-07-04 ENCOUNTER — Other Ambulatory Visit (HOSPITAL_COMMUNITY)
Admission: RE | Admit: 2023-07-04 | Discharge: 2023-07-04 | Disposition: A | Discharge status: Home / Self Care | Payer: 59 | Source: Ambulatory Visit | Attending: Nurse Practitioner | Admitting: Nurse Practitioner

## 2023-07-04 ENCOUNTER — Other Ambulatory Visit (HOSPITAL_COMMUNITY): Payer: Self-pay

## 2023-07-04 VITALS — BP 122/84 | HR 95 | Temp 97.6°F | Ht 62.0 in | Wt 215.6 lb

## 2023-07-04 DIAGNOSIS — N898 Other specified noninflammatory disorders of vagina: Secondary | ICD-10-CM | POA: Diagnosis not present

## 2023-07-04 DIAGNOSIS — K219 Gastro-esophageal reflux disease without esophagitis: Secondary | ICD-10-CM

## 2023-07-04 DIAGNOSIS — G43709 Chronic migraine without aura, not intractable, without status migrainosus: Secondary | ICD-10-CM

## 2023-07-04 DIAGNOSIS — R8290 Unspecified abnormal findings in urine: Secondary | ICD-10-CM

## 2023-07-04 LAB — POCT URINALYSIS DIPSTICK
Bilirubin, UA: NEGATIVE
Blood, UA: POSITIVE
Glucose, UA: NEGATIVE
Ketones, UA: NEGATIVE
Leukocytes, UA: NEGATIVE
Nitrite, UA: NEGATIVE
Protein, UA: NEGATIVE
Spec Grav, UA: 1.015 (ref 1.010–1.025)
Urobilinogen, UA: 0.2 U/dL
pH, UA: 6 (ref 5.0–8.0)

## 2023-07-04 MED ORDER — OMEPRAZOLE 40 MG PO CPDR
40.0000 mg | DELAYED_RELEASE_CAPSULE | Freq: Every day | ORAL | 0 refills | Status: DC
  Filled 2023-07-04: qty 14, 14d supply, fill #0

## 2023-07-04 MED ORDER — NURTEC 75 MG PO TBDP
75.0000 mg | ORAL_TABLET | ORAL | 0 refills | Status: DC | PRN
  Filled 2023-07-04: qty 8, 30d supply, fill #0
  Filled 2023-07-06 – 2023-07-26 (×2): qty 15, 30d supply, fill #0

## 2023-07-04 NOTE — Progress Notes (Signed)
Established Patient Office Visit  Subjective   Patient ID: Laura [name], female    DOB: 26-Jan-2001  Age: 22 y.o. MRN: [id number]  Chief Complaint  Patient presents with   Migraine    Follow up, constant migraines for the past 2 weeks and especially at night, request to get a CT Scan, burning sensation in throat and chest that comes and goes for 2 weeks, concerns with vaginal itching, bloody nose last Tuesday   HPI:  Discussed the use of AI scribe software for clinical note transcription with the patient, who gave verbal consent to proceed.  History of Present Illness   The patient, with a history of migraines, presents with a two-week history of a persistent headache. The headache is described as consistent throughout the day, coming and going, and is particularly severe in the evenings. The pain is localized to one spot and is so intense that it requires the patient to sit in a dark, quiet room with her eyes closed. The headache can last for two to three hours and may persist after sleep, albeit less intensely. The patient has not noticed any associated nausea but reports difficulty seeing things at a distance. She has an upcoming appointment for a vision check. The patient denies any weakness on one side of the body or confusion but reports that the headache is so severe that it prevents her from getting up and doing anything. The patient has been taking Imitrex as needed but reports that it does not seem to be helping.  In addition to the headache, the patient has been experiencing a burning sensation in her throat and chest for the past two weeks. The sensation is present whether she eats or not and is not significantly relieved by Tums. The patient denies any nasal congestion, sore throat, or ear pain but reports a recent nosebleed, which is unusual for her.  The patient also reports recent vaginal itchiness, which started over the weekend. She denies any vaginal discharge but notes  that her urine has been cloudy. She denies any dysuria. The patient is concerned about the possibility of a sexually transmitted disease and requests testing.      ROS See pertinent positives and negatives per HPI.    Objective:     BP 122/84 (BP Location: Left Arm)   Pulse 95   Temp 97.6 F (36.4 C)   Ht 5\' 2"  (1.575 m)   Wt 215 lb 9.6 oz (97.8 kg)   LMP 06/06/2023 (Exact Date)   SpO2 98%   BMI 39.43 kg/m     Physical Exam Vitals and nursing note reviewed.  Constitutional:      General: She is not in acute distress.    Appearance: Normal appearance.  HENT:     Head: Normocephalic.     Right Ear: Tympanic membrane, ear canal and external ear normal.     Left Ear: Tympanic membrane, ear canal and external ear normal.     Mouth/Throat:     Mouth: Mucous membranes are moist.     Pharynx: No posterior oropharyngeal erythema.  Eyes:     Conjunctiva/sclera: Conjunctivae normal.     Pupils: Pupils are equal, round, and reactive to light.  Cardiovascular:     Rate and Rhythm: Normal rate and regular rhythm.     Pulses: Normal pulses.     Heart sounds: Normal heart sounds.  Pulmonary:     Effort: Pulmonary effort is normal.     Breath sounds: Normal breath  sounds.  Abdominal:     General: There is no distension.     Palpations: Abdomen is soft.     Tenderness: There is abdominal tenderness (epigastric). There is no guarding.     Hernia: No hernia is present.  Musculoskeletal:     Cervical back: Normal range of motion.  Skin:    General: Skin is warm.  Neurological:     General: No focal deficit present.     Mental Status: She is alert and oriented to person, place, and time.     Cranial Nerves: No cranial nerve deficit.     Motor: No weakness.     Gait: Gait normal.  Psychiatric:        Mood and Affect: Mood normal.        Behavior: Behavior normal.        Thought Content: Thought content normal.        Judgment: Judgment normal.    No results found for any  visits on 07/04/23.     Assessment & Plan:   Problem List Items Addressed This Visit       Cardiovascular and Mediastinum   Chronic migraine without aura without status migrainosus, not intractable - Primary    She has experienced persistent headaches for the past two weeks, accompanied by some visual disturbances, yet no neurological deficits were observed on examination. Since Imitrex has not significantly relieved her symptoms, we will switch her medication to Nurtec 75mg , to be taken every other day as needed. We encourage her to keep her eye doctor appointment to assess if visual disturbances are contributing to her headaches. Should there be no improvement with the new medication, a head CT will be considered.      Relevant Medications   Rimegepant Sulfate (NURTEC) 75 MG TBDP     Digestive   Gastroesophageal reflux disease    She reports a burning sensation in her throat and chest for the past two weeks, which Tums has not relieved. We will start Prilosec (omeprazole) 40mg  daily, to be taken first thing in the morning before eating, for two weeks.      Relevant Medications   omeprazole (PRILOSEC) 40 MG capsule   Other Visit Diagnoses     Cloudy urine       Check U/A and treat based on results. Encourage fluids, voiding regularly and can take a cranberry supplement if she would like to try this.   Relevant Orders   POCT urinalysis dipstick   Vaginal itching       Check for BV, yeast, gonorrhea, chlamdyia and treat based on results.   Relevant Orders   Cervicovaginal ancillary only       Return in about 4 weeks (around 08/01/2023) for CPE.    Gerre Scull, NP

## 2023-07-04 NOTE — Assessment & Plan Note (Signed)
She reports a burning sensation in her throat and chest for the past two weeks, which Tums has not relieved. We will start Prilosec (omeprazole) 40mg  daily, to be taken first thing in the morning before eating, for two weeks.

## 2023-07-04 NOTE — Patient Instructions (Signed)
It was great to see you!  Keep your appointment with the eye dr  We are checking your labs today and will let you know the results via mychart/phone.   Start nurtec every other day as needed for migraines. This can help prevent and treat migraines  Start omeprazole 1 tablet daily.   Let's follow-up in 4-6 weeks, sooner if you have concerns.  If a referral was placed today, you will be contacted for an appointment. Please note that routine referrals can sometimes take up to 3-4 weeks to process. Please call our office if you haven't heard anything after this time frame.  Take care,  Rodman Pickle, NP

## 2023-07-04 NOTE — Assessment & Plan Note (Signed)
She has experienced persistent headaches for the past two weeks, accompanied by some visual disturbances, yet no neurological deficits were observed on examination. Since Imitrex has not significantly relieved her symptoms, we will switch her medication to Nurtec 75mg , to be taken every other day as needed. We encourage her to keep her eye doctor appointment to assess if visual disturbances are contributing to her headaches. Should there be no improvement with the new medication, a head CT will be considered.

## 2023-07-05 ENCOUNTER — Encounter: Payer: Self-pay | Admitting: Nurse Practitioner

## 2023-07-06 ENCOUNTER — Encounter: Payer: Self-pay | Admitting: Nurse Practitioner

## 2023-07-06 ENCOUNTER — Other Ambulatory Visit (HOSPITAL_COMMUNITY): Payer: Self-pay

## 2023-07-06 LAB — CERVICOVAGINAL ANCILLARY ONLY
Bacterial Vaginitis (gardnerella): NEGATIVE
Candida Glabrata: NEGATIVE
Candida Vaginitis: POSITIVE — AB
Chlamydia: NEGATIVE
Comment: NEGATIVE
Comment: NEGATIVE
Comment: NEGATIVE
Comment: NEGATIVE
Comment: NEGATIVE
Comment: NORMAL
Neisseria Gonorrhea: NEGATIVE
Trichomonas: NEGATIVE

## 2023-07-06 MED ORDER — FLUCONAZOLE 150 MG PO TABS
150.0000 mg | ORAL_TABLET | ORAL | 0 refills | Status: DC
  Filled 2023-07-06: qty 2, 6d supply, fill #0

## 2023-07-06 NOTE — Addendum Note (Signed)
Addended by: Rodman Pickle A on: 07/06/2023 12:56 PM   Modules accepted: Orders

## 2023-07-07 ENCOUNTER — Other Ambulatory Visit (HOSPITAL_COMMUNITY): Payer: Self-pay

## 2023-07-07 ENCOUNTER — Telehealth: Payer: Self-pay

## 2023-07-07 NOTE — Telephone Encounter (Signed)
Can we get a prior auth on Nurtec 75mg ?

## 2023-07-07 NOTE — Telephone Encounter (Signed)
Pharmacy Patient Advocate Encounter   Received notification from Pt Calls Messages that prior authorization for Nurtec 75mg  is required/requested.   Insurance verification completed.   The patient is insured through [facility] .   Per test claim: PA required; PA submitted to above mentioned insurance via CoverMyMeds Key/confirmation #/EOC [id number] Status is pending

## 2023-07-11 ENCOUNTER — Other Ambulatory Visit (HOSPITAL_COMMUNITY): Payer: Self-pay

## 2023-07-11 NOTE — Telephone Encounter (Signed)
Patient returned call and notified her that Rx of Nurtec approved and to call pharmacy to make sure it is in stock to fill.

## 2023-07-11 NOTE — Telephone Encounter (Signed)
Pharmacy Patient Advocate Encounter  Received notification from [facility] that Prior Authorization for Nurtec 75mg  dispersible tablets has been APPROVED from 07/11/2023 to 01/07/2024. Ran test claim, Copay is $0.00. This test claim was processed through [facility]- copay amounts may vary at other pharmacies due to pharmacy/plan contracts, or as the patient moves through the different stages of their insurance plan.   PA #/Case ID/Reference #: [id number]

## 2023-07-11 NOTE — Telephone Encounter (Signed)
LVM to return call.

## 2023-07-13 ENCOUNTER — Encounter: Payer: Self-pay | Admitting: Nurse Practitioner

## 2023-07-13 ENCOUNTER — Ambulatory Visit: Payer: 59 | Admitting: Nurse Practitioner

## 2023-07-13 VITALS — BP 104/68 | HR 94 | Temp 99.0°F | Ht 62.0 in | Wt 214.6 lb

## 2023-07-13 DIAGNOSIS — J0110 Acute frontal sinusitis, unspecified: Secondary | ICD-10-CM | POA: Diagnosis not present

## 2023-07-13 LAB — POCT INFLUENZA A/B
Influenza A, POC: NEGATIVE
Influenza B, POC: NEGATIVE

## 2023-07-13 LAB — POC COVID19 BINAXNOW: SARS Coronavirus 2 Ag: NEGATIVE

## 2023-07-13 LAB — POCT RAPID STREP A (OFFICE): Rapid Strep A Screen: NEGATIVE

## 2023-07-13 MED ORDER — AMOXICILLIN-POT CLAVULANATE 875-125 MG PO TABS: 1.0000 | ORAL_TABLET | Freq: Two times a day (BID) | ORAL | 0 refills | Status: DC

## 2023-07-13 MED ORDER — FLUCONAZOLE 150 MG PO TABS: 150.0000 mg | ORAL_TABLET | ORAL | 0 refills | Status: DC

## 2023-07-13 NOTE — Progress Notes (Signed)
Acute Office Visit  Subjective:     Patient ID: Laura [name], female    DOB: [birth year], 22 y.o.   MRN: [id number]  Chief Complaint  Patient presents with   Fever    Sorethroat, bodyaches, runny nose, coughing, sneezing started Saturday    HPI Patient is in today for fever, sore throat, and body aches for 5 days.   Discussed the use of AI scribe software for clinical note transcription with the patient, who gave verbal consent to proceed.  History of Present Illness   The patient, with a history of sinus infections, presents with a five-day history of sore throat, cough, sneezing, and fever. She reports feeling hot and nauseous, with body aches in the legs and arms. She also reports ear pain. Despite taking Mucinex and Tylenol, she still had a low-grade fever. She denies shortness of breath but reports some difficulty taking deep breaths. She was exposed to a cousin who was also feeling unwell over the weekend.      ROS See pertinent positives and negatives per HPI.     Objective:    BP 104/68 (BP Location: Left Arm, Patient Position: Sitting, Cuff Size: Normal)   Pulse 94   Temp 99 F (37.2 C) (Oral)   Ht 5\' 2"  (1.575 m)   Wt 214 lb 9.6 oz (97.3 kg)   LMP 06/06/2023 (Exact Date)   BMI 39.25 kg/m    Physical Exam Vitals and nursing note reviewed.  Constitutional:      General: She is not in acute distress.    Appearance: Normal appearance.  HENT:     Head: Normocephalic.     Right Ear: Tympanic membrane, ear canal and external ear normal.     Left Ear: Tympanic membrane, ear canal and external ear normal.     Nose: Congestion present.     Right Sinus: Frontal sinus tenderness present. No maxillary sinus tenderness.     Left Sinus: Frontal sinus tenderness present. No maxillary sinus tenderness.     Mouth/Throat:     Mouth: Mucous membranes are moist.     Pharynx: Posterior oropharyngeal erythema present.  Eyes:     Conjunctiva/sclera: Conjunctivae normal.   Cardiovascular:     Rate and Rhythm: Normal rate and regular rhythm.     Pulses: Normal pulses.     Heart sounds: Normal heart sounds.  Pulmonary:     Effort: Pulmonary effort is normal.     Breath sounds: Normal breath sounds.  Musculoskeletal:     Cervical back: Normal range of motion.  Skin:    General: Skin is warm.  Neurological:     General: No focal deficit present.     Mental Status: She is alert and oriented to person, place, and time.  Psychiatric:        Mood and Affect: Mood normal.        Behavior: Behavior normal.        Thought Content: Thought content normal.        Judgment: Judgment normal.     Results for orders placed or performed in visit on 07/13/23  POCT Influenza A/B  Result Value Ref Range   Influenza A, POC Negative Negative   Influenza B, POC Negative Negative  POCT rapid strep A  Result Value Ref Range   Rapid Strep A Screen Negative Negative  POC COVID-19 BinaxNow  Result Value Ref Range   SARS Coronavirus 2 Ag Negative Negative  Assessment & Plan:     Sinusitis   Symptoms began with a sore throat, coughing, and sneezing, and have progressed to include fever, body aches, and ear pain, without shortness of breath. We will start Augmentin twice a day for 10 days and continue Tylenol or ibuprofen as needed for fever and body aches. She should drink plenty of fluids and rest. After finishing the antibiotics, she will take Diflucan to prevent a yeast infection. She can return to work on 07/15/2023 if symptoms improve. If symptoms worsen or do not improve, she should contact the office. A work note for early leave on 07/13/2023 will be provided.      Meds ordered this encounter  Medications   amoxicillin-clavulanate (AUGMENTIN) 875-125 MG tablet    Sig: Take 1 tablet by mouth 2 (two) times daily.    Dispense:  20 tablet    Refill:  0   fluconazole (DIFLUCAN) 150 MG tablet    Sig: Take 1 tablet (150 mg total) by mouth every 3 (three)  days. After finishing antibiotic    Dispense:  2 tablet    Refill:  0    Return if symptoms worsen or fail to improve.  Gerre Scull, NP

## 2023-07-13 NOTE — Patient Instructions (Signed)
It was great to see you!  Start augmentin twice a day for 10 days   Take diflucan after finishing   Drink plenty of fluids and get rest   Let's follow-up if your symptoms worsen or don't improve.   Take care,  Rodman Pickle, NP

## 2023-07-26 ENCOUNTER — Other Ambulatory Visit (HOSPITAL_COMMUNITY): Payer: Self-pay

## 2023-08-15 ENCOUNTER — Other Ambulatory Visit (HOSPITAL_COMMUNITY): Payer: Self-pay

## 2023-08-18 ENCOUNTER — Other Ambulatory Visit (HOSPITAL_COMMUNITY): Payer: Self-pay

## 2023-10-06 ENCOUNTER — Ambulatory Visit (INDEPENDENT_AMBULATORY_CARE_PROVIDER_SITE_OTHER): Payer: 59 | Admitting: Nurse Practitioner

## 2023-10-06 ENCOUNTER — Other Ambulatory Visit (HOSPITAL_COMMUNITY)
Admission: RE | Admit: 2023-10-06 | Discharge: 2023-10-06 | Disposition: A | Discharge status: Home / Self Care | Source: Ambulatory Visit | Attending: Nurse Practitioner | Admitting: Nurse Practitioner

## 2023-10-06 ENCOUNTER — Encounter: Payer: Self-pay | Admitting: Nurse Practitioner

## 2023-10-06 VITALS — BP 108/72 | HR 96 | Temp 97.1°F | Ht 62.0 in | Wt 211.0 lb

## 2023-10-06 DIAGNOSIS — G43709 Chronic migraine without aura, not intractable, without status migrainosus: Secondary | ICD-10-CM | POA: Diagnosis not present

## 2023-10-06 DIAGNOSIS — E669 Obesity, unspecified: Secondary | ICD-10-CM | POA: Diagnosis not present

## 2023-10-06 DIAGNOSIS — Z124 Encounter for screening for malignant neoplasm of cervix: Secondary | ICD-10-CM | POA: Insufficient documentation

## 2023-10-06 DIAGNOSIS — Z0000 Encounter for general adult medical examination without abnormal findings: Secondary | ICD-10-CM | POA: Diagnosis not present

## 2023-10-06 DIAGNOSIS — Z113 Encounter for screening for infections with a predominantly sexual mode of transmission: Secondary | ICD-10-CM | POA: Insufficient documentation

## 2023-10-06 NOTE — Assessment & Plan Note (Signed)
Health maintenance reviewed and updated. Discussed nutrition, exercise. Follow-up 1 year.

## 2023-10-06 NOTE — Assessment & Plan Note (Signed)
 BMI 38.5. She has lost 4 pounds since her last visit. Continue focus on nutrition and exercise

## 2023-10-06 NOTE — Patient Instructions (Signed)
 It was great to see you!  I will let you know how your swab comes out  Keep up the great work!  Let's follow-up in 1 year, sooner if you have concerns.  If a referral was placed today, you will be contacted for an appointment. Please note that routine referrals can sometimes take up to 3-4 weeks to process. Please call our office if you haven't heard anything after this time frame.  Take care,  Rodman Pickle, NP

## 2023-10-06 NOTE — Assessment & Plan Note (Signed)
 Chronic, stable. Continue nurtec 75mg  every other day as needed. Since getting glasses, her headaches have improved.

## 2023-10-06 NOTE — Progress Notes (Signed)
 BP 108/72 (BP Location: Left Arm, Patient Position: Sitting, Cuff Size: Normal)   Pulse 96   Temp (!) 97.1 F (36.2 C)   Ht 5\' 2"  (1.575 m)   Wt 211 lb (95.7 kg)   LMP 07/23/2023 (Approximate)   SpO2 97%   BMI 38.59 kg/m    Subjective:    Patient ID: Laura [name], female    DOB: 11/25/2000, 23 y.o.   MRN: [id number]  CC: Chief Complaint  Patient presents with   Annual Exam    With fasting lab work, no concerns, updated Tdap    HPI: Laura [name] is a 23 y.o. female presenting on 10/06/2023 for comprehensive medical examination. Current medical complaints include:none  She currently lives with: grandparents Menopausal Symptoms: no  Depression and Anxiety Screen done today and results listed below:     10/06/2023    2:00 PM 07/04/2023    2:54 PM 03/03/2023    2:13 PM 08/09/2022    4:18 PM 07/08/2022    8:22 AM  Depression screen PHQ 2/9  Decreased Interest 0 0 1 0 0  Down, Depressed, Hopeless 0 0 1 0 0  PHQ - 2 Score 0 0 2 0 0  Altered sleeping 0  0    Tired, decreased energy 0  1    Change in appetite 0  1    Feeling bad or failure about yourself  0  0    Trouble concentrating 0  0    Moving slowly or fidgety/restless 0  0    Suicidal thoughts 0  0    PHQ-9 Score 0  4    Difficult doing work/chores Not difficult at all  Somewhat difficult        10/06/2023    2:00 PM 03/03/2023    2:13 PM  GAD 7 : Generalized Anxiety Score  Nervous, Anxious, on Edge 0 0  Control/stop worrying 0 0  Worry too much - different things 0 0  Trouble relaxing 0 0  Restless 0 0  Easily annoyed or irritable 0 0  Afraid - awful might happen 0 0  Total GAD 7 Score 0 0  Anxiety Difficulty Not difficult at all Somewhat difficult    The patient does not have a history of falls. I did not complete a risk assessment for falls. A plan of care for falls was not documented.   Past Medical History:  Past Medical History:  Diagnosis Date   Migraines     Surgical History:  History  reviewed. No pertinent surgical history.  Medications:  Current Outpatient Medications on File Prior to Visit  Medication Sig   Norethindrone Acetate-Ethinyl Estradiol (LOESTRIN) 1.5-30 MG-MCG tablet Take 1 tablet by mouth daily.   omeprazole (PRILOSEC) 40 MG capsule Take 1 capsule (40 mg total) by mouth daily.   Rimegepant Sulfate (NURTEC) 75 MG TBDP Take 1 tablet (75 mg total) by mouth every other day as needed (migraine).   amoxicillin-clavulanate (AUGMENTIN) 875-125 MG tablet Take 1 tablet by mouth 2 (two) times daily. (Patient not taking: Reported on 10/06/2023)   fluconazole (DIFLUCAN) 150 MG tablet Take 1 tablet (150 mg total) by mouth every 3 (three) days. After finishing antibiotic (Patient not taking: Reported on 10/06/2023)   ibuprofen (ADVIL) 800 MG tablet Take 1 tablet (800 mg total) by mouth 3 (three) times daily. (Patient not taking: Reported on 10/06/2023)   SUMAtriptan (IMITREX) 25 MG tablet Take 1 tablet as needed for migraine. May repeat in 2 hours  if headache persists or recurs. (Patient not taking: Reported on 10/06/2023)   No current facility-administered medications on file prior to visit.    Allergies:  No Known Allergies  Social History:  Social History   Socioeconomic History   Marital status: Single    Spouse name: Not on file   Number of children: Not on file   Years of education: Not on file   Highest education level: Not on file  Occupational History   Not on file  Tobacco Use   Smoking status: Former    Types: Cigarettes   Smokeless tobacco: Never  Vaping Use   Vaping status: Never Used  Substance and Sexual Activity   Alcohol use: Yes    Alcohol/week: 2.0 standard drinks of alcohol    Types: 2 Shots of liquor per week    Comment: Occassionally.   Drug use: Yes    Types: Marijuana    Comment: twice a week   Sexual activity: Not on file    Comment: depo  Other Topics Concern   Not on file  Social History Narrative   Not on file   Social  Drivers of Health   Financial Resource Strain: Not on file  Food Insecurity: Not on file  Transportation Needs: Not on file  Physical Activity: Not on file  Stress: Not on file  Social Connections: Not on file  Intimate Partner Violence: Not on file   Social History   Tobacco Use  Smoking Status Former   Types: Cigarettes  Smokeless Tobacco Never   Social History   Substance and Sexual Activity  Alcohol Use Yes   Alcohol/week: 2.0 standard drinks of alcohol   Types: 2 Shots of liquor per week   Comment: Occassionally.    Family History:  Family History  Problem Relation Age of Onset   Hypertension Mother     Past medical history, surgical history, medications, allergies, family history and social history reviewed with patient today and changes made to appropriate areas of the chart.   Review of Systems  Constitutional: Negative.   HENT: Negative.    Eyes: Negative.   Respiratory: Negative.    Cardiovascular: Negative.   Gastrointestinal: Negative.   Genitourinary: Negative.   Musculoskeletal: Negative.   Skin: Negative.   Neurological: Negative.   Psychiatric/Behavioral: Negative.     All other ROS negative except what is listed above and in the HPI.      Objective:    BP 108/72 (BP Location: Left Arm, Patient Position: Sitting, Cuff Size: Normal)   Pulse 96   Temp (!) 97.1 F (36.2 C)   Ht 5\' 2"  (1.575 m)   Wt 211 lb (95.7 kg)   LMP 07/23/2023 (Approximate)   SpO2 97%   BMI 38.59 kg/m   Wt Readings from Last 3 Encounters:  10/06/23 211 lb (95.7 kg)  07/13/23 214 lb 9.6 oz (97.3 kg)  07/04/23 215 lb 9.6 oz (97.8 kg)    Physical Exam Vitals and nursing note reviewed. Exam conducted with a chaperone present.  Constitutional:      General: She is not in acute distress.    Appearance: Normal appearance.  HENT:     Head: Normocephalic and atraumatic.     Right Ear: Tympanic membrane, ear canal and external ear normal.     Left Ear: Tympanic  membrane, ear canal and external ear normal.  Eyes:     Conjunctiva/sclera: Conjunctivae normal.  Cardiovascular:     Rate and Rhythm: Normal rate and  regular rhythm.     Pulses: Normal pulses.     Heart sounds: Normal heart sounds.  Pulmonary:     Effort: Pulmonary effort is normal.     Breath sounds: Normal breath sounds.  Abdominal:     Palpations: Abdomen is soft.     Tenderness: There is no abdominal tenderness.  Genitourinary:    General: Normal vulva.     Exam position: Lithotomy position.     Labia:        Right: No rash, tenderness or lesion.        Left: No rash, tenderness or lesion.      Vagina: Normal.     Cervix: Normal.     Uterus: Normal.      Adnexa: Right adnexa normal and left adnexa normal.  Musculoskeletal:        General: Normal range of motion.     Cervical back: Normal range of motion and neck supple.     Right lower leg: No edema.     Left lower leg: No edema.  Lymphadenopathy:     Cervical: No cervical adenopathy.  Skin:    General: Skin is warm and dry.  Neurological:     General: No focal deficit present.     Mental Status: She is alert and oriented to person, place, and time.     Cranial Nerves: No cranial nerve deficit.     Coordination: Coordination normal.     Gait: Gait normal.  Psychiatric:        Mood and Affect: Mood normal.        Behavior: Behavior normal.        Thought Content: Thought content normal.        Judgment: Judgment normal.     Results for orders placed or performed in visit on 07/13/23  POCT Influenza A/B   Collection Time: 07/13/23  3:32 PM  Result Value Ref Range   Influenza A, POC Negative Negative   Influenza B, POC Negative Negative  POCT rapid strep A   Collection Time: 07/13/23  3:33 PM  Result Value Ref Range   Rapid Strep A Screen Negative Negative  POC COVID-19 BinaxNow   Collection Time: 07/13/23  3:33 PM  Result Value Ref Range   SARS Coronavirus 2 Ag Negative Negative      Assessment & Plan:    Problem List Items Addressed This Visit       Cardiovascular and Mediastinum   Chronic migraine without aura without status migrainosus, not intractable   Chronic, stable. Continue nurtec 75mg  every other day as needed. Since getting glasses, her headaches have improved.         Other   Routine general medical examination at a health care facility - Primary   Health maintenance reviewed and updated. Discussed nutrition, exercise. Follow-up 1 year.        Obesity (BMI 30-39.9)   BMI 38.5. She has lost 4 pounds since her last visit. Continue focus on nutrition and exercise       Other Visit Diagnoses       Screening for cervical cancer       Pap done today   Relevant Orders   Cytology - PAP     Screen for STD (sexually transmitted disease)       Screen BV, yeast, gonorrhea, chlamydia per request   Relevant Orders   Cervicovaginal ancillary only        Follow up plan: Return in about 1 year (  around 10/05/2024) for CPE.   LABORATORY TESTING:  - Pap smear: pap done  IMMUNIZATIONS:   - Tdap: Tetanus vaccination status reviewed: declined. - Influenza: Declined - Pneumovax: Not applicable - Prevnar: Not applicable - HPV: Up to date - Shingrix vaccine: Not applicable  SCREENING: -Mammogram: Not applicable  - Colonoscopy: Not applicable  - Bone Density: Not applicable   PATIENT COUNSELING:   Advised to take 1 mg of folate supplement per day if capable of pregnancy.   Sexuality: Discussed sexually transmitted diseases, partner selection, use of condoms, avoidance of unintended pregnancy  and contraceptive alternatives.   Advised to avoid cigarette smoking.  I discussed with the patient that most people either abstain from alcohol or drink within safe limits (<=14/week and <=4 drinks/occasion for males, <=7/weeks and <= 3 drinks/occasion for females) and that the risk for alcohol disorders and other health effects rises proportionally with the number of drinks per  week and how often a drinker exceeds daily limits.  Discussed cessation/primary prevention of drug use and availability of treatment for abuse.   Diet: Encouraged to adjust caloric intake to maintain  or achieve ideal body weight, to reduce intake of dietary saturated fat and total fat, to limit sodium intake by avoiding high sodium foods and not adding table salt, and to maintain adequate dietary potassium and calcium preferably from fresh fruits, vegetables, and low-fat dairy products.    stressed the importance of regular exercise  Injury prevention: Discussed safety belts, safety helmets, smoke detector, smoking near bedding or upholstery.   Dental health: Discussed importance of regular tooth brushing, flossing, and dental visits.    NEXT PREVENTATIVE PHYSICAL DUE IN 1 YEAR. Return in about 1 year (around 10/05/2024) for CPE.  Shada Nienaber A Lelynd Poer

## 2023-10-07 ENCOUNTER — Other Ambulatory Visit (HOSPITAL_COMMUNITY): Payer: Self-pay

## 2023-10-07 ENCOUNTER — Encounter: Payer: Self-pay | Admitting: Nurse Practitioner

## 2023-10-07 LAB — CERVICOVAGINAL ANCILLARY ONLY
Bacterial Vaginitis (gardnerella): NEGATIVE
Candida Glabrata: NEGATIVE
Candida Vaginitis: POSITIVE — AB
Chlamydia: NEGATIVE
Comment: NEGATIVE
Comment: NEGATIVE
Comment: NEGATIVE
Comment: NEGATIVE
Comment: NEGATIVE
Comment: NORMAL
Neisseria Gonorrhea: NEGATIVE
Trichomonas: NEGATIVE

## 2023-10-07 MED ORDER — FLUCONAZOLE 150 MG PO TABS
150.0000 mg | ORAL_TABLET | Freq: Once | ORAL | 0 refills | Status: AC
  Filled 2023-10-07: qty 1, 1d supply, fill #0

## 2023-10-07 NOTE — Addendum Note (Signed)
 Addended by: Rodman Pickle A on: 10/07/2023 02:37 PM   Modules accepted: Orders

## 2023-10-10 LAB — CYTOLOGY - PAP: Diagnosis: NEGATIVE

## 2023-12-14 ENCOUNTER — Other Ambulatory Visit (HOSPITAL_COMMUNITY): Payer: Self-pay

## 2023-12-14 ENCOUNTER — Other Ambulatory Visit (HOSPITAL_COMMUNITY)
Admission: RE | Admit: 2023-12-14 | Discharge: 2023-12-14 | Disposition: A | Discharge status: Home / Self Care | Source: Ambulatory Visit | Attending: Nurse Practitioner | Admitting: Nurse Practitioner

## 2023-12-14 ENCOUNTER — Encounter: Payer: Self-pay | Admitting: Nurse Practitioner

## 2023-12-14 ENCOUNTER — Ambulatory Visit: Admitting: Nurse Practitioner

## 2023-12-14 VITALS — BP 124/86 | HR 78 | Temp 97.1°F | Ht 62.0 in | Wt 215.8 lb

## 2023-12-14 DIAGNOSIS — B3731 Acute candidiasis of vulva and vagina: Secondary | ICD-10-CM | POA: Diagnosis not present

## 2023-12-14 DIAGNOSIS — N9489 Other specified conditions associated with female genital organs and menstrual cycle: Secondary | ICD-10-CM

## 2023-12-14 DIAGNOSIS — Z113 Encounter for screening for infections with a predominantly sexual mode of transmission: Secondary | ICD-10-CM | POA: Insufficient documentation

## 2023-12-14 DIAGNOSIS — R309 Painful micturition, unspecified: Secondary | ICD-10-CM | POA: Diagnosis not present

## 2023-12-14 LAB — POC URINALSYSI DIPSTICK (AUTOMATED)
Bilirubin, UA: NEGATIVE
Blood, UA: 25
Glucose, UA: NEGATIVE
Ketones, UA: NEGATIVE
Leukocytes, UA: NEGATIVE
Nitrite, UA: NEGATIVE
Protein, UA: NEGATIVE
Spec Grav, UA: >=1.03 — AB (ref 1.010–1.025)
Urobilinogen, UA: 0.2 U/dL
pH, UA: 5.5 (ref 5.0–8.0)

## 2023-12-14 MED ORDER — FLUCONAZOLE 150 MG PO TABS: ORAL_TABLET | ORAL | 0 refills | Status: DC

## 2023-12-14 MED ORDER — FLUCONAZOLE 150 MG PO TABS
ORAL_TABLET | ORAL | 0 refills | Status: DC
Start: 2023-12-14 — End: 2023-12-14
  Filled 2023-12-14: qty 2, fill #0

## 2023-12-14 NOTE — Patient Instructions (Addendum)
 It was great to see you!  Start diflucan  1 tablet today and a second tablet in 3 days if needed   I will let you know the results of your swab  Let's follow-up if symptoms worsen or don't improve  Take care,  Rheba Cedar, NP

## 2023-12-14 NOTE — Progress Notes (Signed)
 Acute Office Visit  Subjective:     Patient ID: Laura [name], female    DOB: 2001-04-29, 23 y.o.   MRN: [id number]  Chief Complaint  Patient presents with   Vaginal Pain    Burning and itching for about two weeks unknown if its a uti or yeast infection    HPI Discussed the use of AI scribe software for clinical note transcription with the patient, who gave verbal consent to proceed.  History of Present Illness   Laura [name] is a 23 year old female who presents with vaginal burning after urination.  Vaginal burning occurs after urination, showering, and using wipes, persisting for about one and a half to two weeks. Bevin Bucks particles are present in vaginal discharge after wiping. She is sexually active and had unprotected intercourse approximately two weeks ago. She has recurrent yeast infections, with the last occurrence in March, including both bacterial vaginosis and yeast infections. Symptoms initially resolved but recurred. No fever, stomach pain, or back pain. No observed redness in the vaginal area, but irritation is felt when wiping.      ROS See pertinent positives and negatives per HPI.     Objective:    BP 124/86 (BP Location: Left Arm, Patient Position: Sitting, Cuff Size: Normal)   Pulse 78   Temp (!) 97.1 F (36.2 C) (Temporal)   Ht 5\' 2"  (1.575 m)   Wt 215 lb 12.8 oz (97.9 kg)   LMP 11/14/2023 (Approximate)   BMI 39.47 kg/m    Physical Exam Vitals and nursing note reviewed.  Constitutional:      General: She is not in acute distress.    Appearance: Normal appearance.  HENT:     Head: Normocephalic.  Eyes:     Conjunctiva/sclera: Conjunctivae normal.  Pulmonary:     Effort: Pulmonary effort is normal.  Abdominal:     Palpations: Abdomen is soft.     Tenderness: There is no abdominal tenderness. There is no right CVA tenderness or left CVA tenderness.  Musculoskeletal:     Cervical back: Normal range of motion.  Skin:    General: Skin is  warm.  Neurological:     General: No focal deficit present.     Mental Status: She is alert and oriented to person, place, and time.  Psychiatric:        Mood and Affect: Mood normal.        Behavior: Behavior normal.        Thought Content: Thought content normal.        Judgment: Judgment normal.     Results for orders placed or performed in visit on 12/14/23  POCT Urinalysis Dipstick (Automated)  Result Value Ref Range   Color, UA yellow    Clarity, UA clear    Glucose, UA Negative Negative   Bilirubin, UA negative    Ketones, UA negative    Spec Grav, UA >=1.030 (A) 1.010 - 1.025   Blood, UA 25    pH, UA 5.5 5.0 - 8.0   Protein, UA Negative Negative   Urobilinogen, UA 0.2 0.2 or 1.0 E.U./dL   Nitrite, UA negative    Leukocytes, UA Negative Negative        Assessment & Plan:   Problem List Items Addressed This Visit   None Visit Diagnoses       Pain with urination    -  Primary   U/A negative. Encourage fluids, will check vaginal swab as noted below for yeast,  BV.   Relevant Orders   POCT Urinalysis Dipstick (Automated) (Completed)     Vaginal burning       She does have prior history of BV and yeast. Will check swab for this and gonorrhea/chlamydia. Start diflcuan 150mg  x1 and second tablet in 3 days if needed.   Relevant Orders   Cervicovaginal ancillary only       Meds ordered this encounter  Medications   DISCONTD: fluconazole  (DIFLUCAN ) 150 MG tablet    Sig: Take 1 tablet today and a second tablet in 3 days if needed    Dispense:  2 tablet    Refill:  0   fluconazole  (DIFLUCAN ) 150 MG tablet    Sig: Take 1 tablet today and a second tablet in 3 days if needed    Dispense:  2 tablet    Refill:  0    No follow-ups on file.  Laura Benjamin, NP

## 2023-12-16 ENCOUNTER — Ambulatory Visit: Payer: Self-pay | Admitting: Nurse Practitioner

## 2023-12-16 LAB — CERVICOVAGINAL ANCILLARY ONLY
Bacterial Vaginitis (gardnerella): NEGATIVE
Candida Glabrata: NEGATIVE
Candida Vaginitis: POSITIVE — AB
Chlamydia: NEGATIVE
Comment: NEGATIVE
Comment: NEGATIVE
Comment: NEGATIVE
Comment: NEGATIVE
Comment: NEGATIVE
Comment: NORMAL
Neisseria Gonorrhea: NEGATIVE
Trichomonas: NEGATIVE

## 2023-12-26 ENCOUNTER — Telehealth: Payer: Self-pay

## 2023-12-26 ENCOUNTER — Other Ambulatory Visit (HOSPITAL_COMMUNITY): Payer: Self-pay

## 2023-12-26 NOTE — Telephone Encounter (Signed)
 Pharmacy Patient Advocate Encounter   Received notification from CoverMyMeds that prior authorization for Nurtec 75 is required/requested.   Insurance verification completed.   The patient is insured through [facility] .   Per test claim: The current 28 day co-pay is, $19.97.  No PA needed at this time. This test claim was processed through [facility]- copay amounts may vary at other pharmacies due to pharmacy/plan contracts, or as the patient moves through the different stages of their insurance plan.

## 2024-03-18 ENCOUNTER — Other Ambulatory Visit (HOSPITAL_COMMUNITY): Payer: Self-pay

## 2024-03-18 ENCOUNTER — Telehealth: Payer: Self-pay | Admitting: Emergency Medicine

## 2024-03-18 ENCOUNTER — Ambulatory Visit
Admission: EM | Admit: 2024-03-18 | Discharge: 2024-03-18 | Disposition: A | Discharge status: Home / Self Care | Attending: Emergency Medicine | Admitting: Emergency Medicine

## 2024-03-18 ENCOUNTER — Encounter: Payer: Self-pay | Admitting: Emergency Medicine

## 2024-03-18 DIAGNOSIS — S29012A Strain of muscle and tendon of back wall of thorax, initial encounter: Secondary | ICD-10-CM

## 2024-03-18 MED ORDER — METHOCARBAMOL 500 MG PO TABS
500.0000 mg | ORAL_TABLET | Freq: Two times a day (BID) | ORAL | 0 refills | Status: DC
Start: 2024-03-18 — End: 2024-03-18
  Filled 2024-03-18: qty 20, 10d supply, fill #0

## 2024-03-18 MED ORDER — METHOCARBAMOL 500 MG PO TABS: 500.0000 mg | ORAL_TABLET | Freq: Two times a day (BID) | ORAL | 0 refills | Status: DC

## 2024-03-18 NOTE — ED Provider Notes (Signed)
 [name]    CSN: [id number] Arrival date & time: 03/18/24  0836      History   Chief Complaint Chief Complaint  Patient presents with   Back Pain    HPI Laura [name] is [name] 23 y.o. female.  Patient presents to urgent care today with concerns of back pain.  Past history significant for obesity and GERD.  Reports that over the last 2 weeks, has had pain in her mid back.  Reports this pain is located in the middle of her back as well as to the sides of her back.  No reported recent injury, fall, or trauma.  No reported tingling, numbness, or weakness.   Back Pain   Past Medical History:  Diagnosis Date   Migraines     Patient Active Problem List   Diagnosis Date Noted   [facility]ine [facility] medical examination [name] 10/06/2023   Obesity (BMI 30-39.9) 10/06/2023   Gastroesophageal reflux disease 07/04/2023   Birth control counseling 07/08/2022   Chronic migraine without aura without status migrainosus, not intractable 07/08/2022    History reviewed. No pertinent surgical history.  OB History   No obstetric history on file.      Home Medications    Prior to Admission medications   Medication Sig Start Date End Date Taking? Authorizing Provider  methocarbamol  (ROBAXIN ) 500 MG tablet Take 1 tablet (500 mg total) by mouth 2 (two) times daily. 03/18/24  Yes [place] Ice A, PA-C  fluconazole  (DIFLUCAN ) 150 MG tablet Take 1 tablet today and [name] second tablet in 3 days if needed 12/14/23   McElwee, Lauren A, NP  ibuprofen  (ADVIL ) 800 MG tablet Take 1 tablet (800 mg total) by mouth 3 (three) times daily. Patient not taking: Reported on 07/13/2023 09/09/20   Wieters, Hallie C, PA-C  Norethindrone  Acetate-Ethinyl Estradiol  (LOESTRIN ) 1.5-30 MG-MCG tablet Take 1 tablet by mouth daily. 05/04/23   McElwee, Lauren A, NP  Rimegepant Sulfate  (NURTEC) 75 MG TBDP Take 1 tablet (75 mg total) by mouth every other day as needed (migraine). Patient not taking:  Reported on 12/14/2023 07/04/23   Nedra Tinnie LABOR, NP  SUMAtriptan  (IMITREX ) 25 MG tablet Take 1 tablet as needed for migraine. May repeat in 2 hours if headache persists or recurs. 05/04/23   McElwee, Tinnie LABOR, NP    Family History Family History  Problem Relation Age of Onset   Hypertension Mother     Social History Social History   Tobacco Use   Smoking status: Former    Types: Cigarettes    Passive exposure: Current   Smokeless tobacco: Never  Vaping Use   Vaping status: Never Used  Substance Use Topics   Alcohol use: Yes    Alcohol/week: 2.0 standard drinks of alcohol    Types: 2 Shots of liquor per week    Comment: Occassionally.   Drug use: Yes    Types: Marijuana    Comment: twice [name] week     Allergies   Patient has no known allergies.   Review of Systems Review of Systems  Musculoskeletal:  Positive for back pain.  All other systems reviewed and are negative.    Physical Exam Triage Vital Signs ED Triage Vitals  Encounter Vitals Group     BP 03/18/24 0856 117/80     Girls Systolic BP Percentile --      Girls Diastolic BP Percentile --      Boys Systolic BP Percentile --  Boys Diastolic BP Percentile --      Pulse Rate 03/18/24 0856 81     Resp 03/18/24 0856 18     Temp 03/18/24 0856 98.3 F (36.8 C)     Temp src --      SpO2 03/18/24 0856 97 %     Weight 03/18/24 0854 215 lb 13.3 oz (97.9 kg)     Height --      Head Circumference --      Peak Flow --      Pain Score 03/18/24 0852 8     Pain Loc --      Pain Education --      Exclude from Growth Chart --    No data found.  Updated Vital Signs BP 117/80 (BP Location: Right Arm)   Pulse 81   Temp 98.3 F (36.8 C)   Resp 18   Wt 215 lb 13.3 oz (97.9 kg)   LMP 02/03/2024 (Approximate)   SpO2 97%   BMI 39.48 kg/m   Visual Acuity Right Eye Distance:   Left Eye Distance:   Bilateral Distance:    Right Eye Near:   Left Eye Near:    Bilateral Near:     Physical Exam Vitals and  nursing note reviewed.  Constitutional:      General: She is not in acute distress.    Appearance: She is well-developed.  HENT:     Head: Normocephalic and atraumatic.  Eyes:     Conjunctiva/sclera: Conjunctivae normal.  Cardiovascular:     Rate and Rhythm: Normal rate and regular rhythm.     Heart sounds: No murmur heard. Pulmonary:     Effort: Pulmonary effort is normal. No respiratory distress.     Breath sounds: Normal breath sounds.  Abdominal:     Palpations: Abdomen is soft.     Tenderness: There is no abdominal tenderness.  Musculoskeletal:        General: Tenderness present. No swelling, deformity or signs of injury.       Arms:     Cervical back: Neck supple.  Skin:    General: Skin is warm and dry.     Capillary Refill: Capillary refill takes less than 2 seconds.  Neurological:     Mental Status: She is alert.  Psychiatric:        Mood and Affect: Mood normal.      UC Treatments / Results  Labs (all labs ordered are listed, but only abnormal results are displayed) Labs Reviewed - No data to display  EKG   Radiology No results found.  Procedures Procedures (including critical care time)  Medications Ordered in UC Medications - No data to display  Initial Impression / Assessment and Plan / UC Course  I have reviewed the triage vital signs and the nursing notes.  Pertinent labs & imaging results that were available during my care of the patient were reviewed by me and considered in my medical decision making (see chart for details).   This patient presents to the UC for concern of back pain.  Differential diagnosis includes thoracic spine strain, disc herniation, thoracic radiculopathy, nerve impingement   Problem List / UC Course:  Patient presents to urgent care today with concerns of back pain.  Reports she has been having ongoing mid back pain for the last 2 weeks.  She states that she works in an [employer] and does occasionally have to  lift some heavier items and is unsure if this may  have caused her pain.  Has not tried any aspirin without improvement in symptoms.  No reported tingling, numbness, paresthesias. Physical exam reveals tenderness to the thoracic spine and the paraspinal muscles next to it.  No worsening pain with flexion or extension of the back. Based on symptoms and exam, concern for likely thoracic spine strain.  Will initiate Robaxin  and additional patient's anti-inflammatory regimen.  Return precautions discussed such as concerns for new or worsening symptoms.  Otherwise stable for outpatient follow-up and discharged home.   Social Determinants of Health:  None  Final Clinical Impressions(s) / UC Diagnoses   Final diagnoses:  Strain of thoracic back region     Discharge Instructions      You were seen today for concerns of back pain. Based on your exam, you appear to have strained your thoracic spine, likely from your work with some straining and lifting. I would make sure you are taking ibuprofen  or naproxen to help with the inflammatory process of this pain but I have also started you on Robaxin  which is muscle relaxer for this pain. For any concerns of worsening symptoms, return to the urgent care.     ED Prescriptions     Medication Sig Dispense Auth. Provider   methocarbamol  (ROBAXIN ) 500 MG tablet Take 1 tablet (500 mg total) by mouth 2 (two) times daily. 20 tablet Laura Deiss A, PA-C      PDMP not reviewed this encounter.   Akayla Brass A, PA-C 03/18/24 0923

## 2024-03-18 NOTE — ED Triage Notes (Addendum)
 Pt presents c/o back pain x 14 days. Pt reports the pain is located in the middle of her back. She also reports when she sits up straight the pain intensifies or tightens. Pt denies fall and/or injury. Pt reports using OTC Icy Hot for the area and a massage gun to help improve the sxs. Neither is helping.

## 2024-03-18 NOTE — Discharge Instructions (Addendum)
 You were seen today for concerns of back pain. Based on your exam, you appear to have strained your thoracic spine, likely from your work with some straining and lifting. I would make sure you are taking ibuprofen  or naproxen to help with the inflammatory process of this pain but I have also started you on Robaxin  which is muscle relaxer for this pain. For any concerns of worsening symptoms, return to the urgent care.

## 2024-03-23 ENCOUNTER — Telehealth: Payer: Self-pay

## 2024-03-23 NOTE — Telephone Encounter (Signed)
 I called and spoke with patient and scheduled her for Thursday 03/29/24 with Lauren and placed her on cancellation list to be notified of an earlier appointment.

## 2024-03-23 NOTE — Telephone Encounter (Signed)
 Copied from CRM [id number]. Topic: Appointments - Scheduling Inquiry for Clinic >> Mar 23, 2024  2:25 PM [place] wrote: Reason for CRM: Strained back- went to urgent care on 8/24- told to follow up- still having pain

## 2024-03-29 ENCOUNTER — Ambulatory Visit: Admitting: Nurse Practitioner

## 2024-03-29 ENCOUNTER — Encounter: Payer: Self-pay | Admitting: Nurse Practitioner

## 2024-03-29 VITALS — BP 114/70 | HR 108 | Temp 99.0°F | Ht 62.0 in | Wt 217.6 lb

## 2024-03-29 DIAGNOSIS — S39012A Strain of muscle, fascia and tendon of lower back, initial encounter: Secondary | ICD-10-CM | POA: Insufficient documentation

## 2024-03-29 DIAGNOSIS — S39012D Strain of muscle, fascia and tendon of lower back, subsequent encounter: Secondary | ICD-10-CM

## 2024-03-29 DIAGNOSIS — J3489 Other specified disorders of nose and nasal sinuses: Secondary | ICD-10-CM

## 2024-03-29 LAB — POC COVID19 BINAXNOW: SARS Coronavirus 2 Ag: NEGATIVE

## 2024-03-29 MED ORDER — LIDOCAINE 5 % EX PTCH: 1.0000 | MEDICATED_PATCH | CUTANEOUS | 0 refills | Status: DC

## 2024-03-29 NOTE — Progress Notes (Signed)
 Established Patient Office Visit  Subjective   Patient ID: Laura [name], female    DOB: 2001-04-06  Age: 23 y.o. MRN: [id number]  Chief Complaint  Patient presents with   Back Pain    Went to urgent care one week ago, runny nose and feels hot   HPI:  Discussed the use of AI scribe software for clinical note transcription with the patient, who gave verbal consent to proceed.  History of Present Illness   Laura [name] is a 23 year old female who presents with upper back pain and congestion symptoms.  She has experienced upper back pain for one month, worsening significantly two weeks ago. The pain is a burning sensation below the shoulders, exacerbated by lifting at work. Avoiding lifting has helped. A muscle relaxer, methocarbamol , caused drowsiness without pain relief. Tylenol, aspirin, and a heating pad provided minimal benefit, while a massage offered some relief.  She developed a runny nose and a sensation of warmth two days ago, with a recorded temperature of 68F. There is no sore throat, cough, or shortness of breath, but she experiences ear pressure and headaches. She has been taking DayQuil since this morning and avoided Tylenol today to prevent excessive acetaminophen intake. Congestion and headaches persist.  She denies arm weakness but notes leg aching when back pain escalates. There is no dysuria or increased urinary frequency.       ROS See pertinent positives and negatives per HPI.    Objective:     BP 114/70 (BP Location: Left Arm, Patient Position: Sitting, Cuff Size: Normal)   Pulse (!) 108   Temp 99 F (37.2 C) (Oral)   Ht 5' 2 (1.575 m)   Wt 217 lb 9.6 oz (98.7 kg)   LMP 03/19/2024 (Exact Date)   SpO2 97%   BMI 39.80 kg/m    Physical Exam Vitals and nursing note reviewed.  Constitutional:      General: She is not in acute distress.    Appearance: Normal appearance.  HENT:     Head: Normocephalic.     Right Ear: Tympanic membrane, ear  canal and external ear normal.     Left Ear: Tympanic membrane, ear canal and external ear normal.     Nose: Congestion and rhinorrhea present.     Mouth/Throat:     Mouth: Mucous membranes are moist.     Pharynx: No posterior oropharyngeal erythema.  Eyes:     Conjunctiva/sclera: Conjunctivae normal.  Cardiovascular:     Rate and Rhythm: Normal rate and regular rhythm.     Pulses: Normal pulses.     Heart sounds: Normal heart sounds.  Pulmonary:     Effort: Pulmonary effort is normal.     Breath sounds: Normal breath sounds.  Abdominal:     Tenderness: There is no right CVA tenderness or left CVA tenderness.  Musculoskeletal:        General: Tenderness (mid thoracic spine and paraspinal muscles) present.     Cervical back: Normal range of motion.  Skin:    General: Skin is warm.  Neurological:     General: No focal deficit present.     Mental Status: She is alert and oriented to person, place, and time.  Psychiatric:        Mood and Affect: Mood normal.        Behavior: Behavior normal.        Thought Content: Thought content normal.        Judgment: Judgment normal.  Results for orders placed or performed in visit on 03/29/24  POC COVID-19 BinaxNow  Result Value Ref Range   SARS Coronavirus 2 Ag Negative Negative     Assessment & Plan:   Problem List Items Addressed This Visit       Musculoskeletal and Integument   Strain of back - Primary   Chronic thoracic back pain presents with a burning sensation, worsened by lifting and bending, and improved with rest. Muscle relaxers have been ineffective. No neurological deficits are noted, and pain has improved some since the urgent care visit 1.5 weeks ago. Prescribe lidocaine  5% patches for morning application, to be removed at night, and applied to the most painful area. Educate on potential cold sensation. Advise using heat or ice for pain relief and provide instructions for daily back stretches. Recommend no lifting  over ten pounds for one week and continue massage therapy for relief.        Other   Rhinorrhea   Acute runny nose, mild fever, and headaches are likely due to allergic rhinitis exacerbated by seasonal changes. COVID-19 test is negative. Continue DayQuil for symptom relief and avoid additional Tylenol to prevent excessive acetaminophen intake.       Relevant Orders   POC COVID-19 BinaxNow (Completed)    Return if symptoms worsen or fail to improve.    Laura DELENA Harada, NP

## 2024-03-29 NOTE — Patient Instructions (Signed)
 It was great to see you!  Start lidocaine  patch once a day in the morning and take it off at night   Start the back stretches daily   Keep using heat or ice  Keep taking dayquil as needed   No heavy lifting more than 10 pounds for 1 week   Let's follow-up if symptoms worsen or any concerns   Take care,  Tinnie Harada, NP

## 2024-03-29 NOTE — Assessment & Plan Note (Signed)
 Chronic thoracic back pain presents with a burning sensation, worsened by lifting and bending, and improved with rest. Muscle relaxers have been ineffective. No neurological deficits are noted, and pain has improved some since the urgent care visit 1.5 weeks ago. Prescribe lidocaine  5% patches for morning application, to be removed at night, and applied to the most painful area. Educate on potential cold sensation. Advise using heat or ice for pain relief and provide instructions for daily back stretches. Recommend no lifting over ten pounds for one week and continue massage therapy for relief.

## 2024-03-29 NOTE — Assessment & Plan Note (Signed)
 Acute runny nose, mild fever, and headaches are likely due to allergic rhinitis exacerbated by seasonal changes. COVID-19 test is negative. Continue DayQuil for symptom relief and avoid additional Tylenol to prevent excessive acetaminophen intake.

## 2024-04-25 ENCOUNTER — Ambulatory Visit
Admission: EM | Admit: 2024-04-25 | Discharge: 2024-04-25 | Disposition: A | Discharge status: Home / Self Care | Attending: Nurse Practitioner | Admitting: Nurse Practitioner

## 2024-04-25 DIAGNOSIS — Z113 Encounter for screening for infections with a predominantly sexual mode of transmission: Secondary | ICD-10-CM | POA: Diagnosis not present

## 2024-04-25 DIAGNOSIS — N898 Other specified noninflammatory disorders of vagina: Secondary | ICD-10-CM

## 2024-04-25 LAB — POCT URINE PREGNANCY: Preg Test, Ur: NEGATIVE

## 2024-04-25 LAB — POCT URINE DIPSTICK
Bilirubin, UA: NEGATIVE
Glucose, UA: NEGATIVE mg/dL
Ketones, POC UA: NEGATIVE mg/dL
Leukocytes, UA: NEGATIVE
Nitrite, UA: NEGATIVE
POC PROTEIN,UA: NEGATIVE
Spec Grav, UA: >=1.03 — AB (ref 1.010–1.025)
Urobilinogen, UA: 0.2 U/dL
pH, UA: 5.5 (ref 5.0–8.0)

## 2024-04-25 NOTE — ED Provider Notes (Signed)
 [name]    CSN: [id number] Arrival date & time: 04/25/24  1618      History   Chief Complaint Chief Complaint  Patient presents with   UTI Symptoms    HPI Laura [name] is a 23 y.o. female.   Discussed the use of AI scribe software for clinical note transcription with the patient, who gave verbal consent to proceed.   The patient presents with vaginal irritation and dryness that has been ongoing for approximately one week. She reports experiencing a mild ache on the right side of her vagina, specifically under the lip a little bit and closer to the anus. The patient denies any burning sensation when urinating, increased urinary frequency, vaginal discharge, itching, or odor. The patient's last menstrual period began on August 25th. She is not currently using birth control and is sexually active with one female partner in the past 3 months. She reports using condoms sometimes during sexual activity. The patient states that her symptoms are mild, and she is unsure of the cause. Patient is also requesting comprehensive STD testing. She denies any known or expected exposure.  The following sections of the patient's history were reviewed and updated as appropriate: allergies, current medications, past family history, past medical history, past social history, past surgical history, and problem list.     Past Medical History:  Diagnosis Date   Migraines     Patient Active Problem List   Diagnosis Date Noted   Strain of back 03/29/2024   Rhinorrhea 03/29/2024   Routine general medical examination at a health care facility 10/06/2023   Obesity (BMI 30-39.9) 10/06/2023   Gastroesophageal reflux disease 07/04/2023   Birth control counseling 07/08/2022   Chronic migraine without aura without status migrainosus, not intractable 07/08/2022    History reviewed. No pertinent surgical history.  OB History   No obstetric history on file.      Home Medications     Prior to Admission medications   Medication Sig Start Date End Date Taking? Authorizing Provider  Acetaminophen (TYLENOL PO) Take by mouth as needed.    [provider]  fluconazole  (DIFLUCAN ) 150 MG tablet Take 1 tablet today and a second tablet in 3 days if needed Patient not taking: No sig reported 12/14/23   McElwee, Lauren A, NP  ibuprofen  (ADVIL ) 800 MG tablet Take 1 tablet (800 mg total) by mouth 3 (three) times daily. Patient not taking: Reported on 07/13/2023 09/09/20   Wieters, Hallie C, PA-C  lidocaine  (LIDODERM ) 5 % Place 1 patch onto the skin daily. Remove & Discard patch within 12 hours or as directed by MD 03/29/24   Nedra Tinnie LABOR, NP  methocarbamol  (ROBAXIN ) 500 MG tablet Take 1 tablet (500 mg total) by mouth 2 (two) times daily. 03/18/24   Zelaya, Oscar A, PA-C  Norethindrone  Acetate-Ethinyl Estradiol  (LOESTRIN ) 1.5-30 MG-MCG tablet Take 1 tablet by mouth daily. 05/04/23   McElwee, Lauren A, NP  Rimegepant Sulfate  (NURTEC) 75 MG TBDP Take 1 tablet (75 mg total) by mouth every other day as needed (migraine). Patient not taking: Reported on 12/14/2023 07/04/23   Nedra Tinnie A, NP  SUMAtriptan  (IMITREX ) 25 MG tablet Take 1 tablet as needed for migraine. May repeat in 2 hours if headache persists or recurs. Patient not taking: No sig reported 05/04/23   Nedra Tinnie LABOR, NP    Family History Family History  Problem Relation Age of Onset   Hypertension Mother     Social History Social History  Tobacco Use   Smoking status: Former    Types: Cigarettes    Passive exposure: Current   Smokeless tobacco: Never  Vaping Use   Vaping status: Never Used  Substance Use Topics   Alcohol use: Yes    Alcohol/week: 2.0 standard drinks of alcohol    Types: 2 Shots of liquor per week    Comment: Occassionally.   Drug use: Yes    Types: Marijuana    Comment: twice a week     Allergies   Patient has no known allergies.   Review of Systems Review of Systems   Genitourinary:  Negative for dysuria, genital sores, menstrual problem (LMP 03/19/24), urgency and vaginal discharge.       Mild vaginal irritation and ache. No itching. No odor.      Physical Exam Triage Vital Signs ED Triage Vitals  Encounter Vitals Group     BP 04/25/24 1649 114/72     Girls Systolic BP Percentile --      Girls Diastolic BP Percentile --      Boys Systolic BP Percentile --      Boys Diastolic BP Percentile --      Pulse Rate 04/25/24 1649 78     Resp 04/25/24 1649 18     Temp 04/25/24 1649 97.9 F (36.6 C)     Temp Source 04/25/24 1649 Oral     SpO2 04/25/24 1649 96 %     Weight 04/25/24 1646 217 lb (98.4 kg)     Height 04/25/24 1646 5' 2 (1.575 m)     Head Circumference --      Peak Flow --      Pain Score 04/25/24 1643 0     Pain Loc --      Pain Education --      Exclude from Growth Chart --    No data found.  Updated Vital Signs BP 114/72 (BP Location: Right Arm)   Pulse 78   Temp 97.9 F (36.6 C) (Oral)   Resp 18   Ht 5' 2 (1.575 m)   Wt 217 lb (98.4 kg)   LMP 03/19/2024 (Exact Date)   SpO2 96%   BMI 39.69 kg/m   Visual Acuity Right Eye Distance:   Left Eye Distance:   Bilateral Distance:    Right Eye Near:   Left Eye Near:    Bilateral Near:     Physical Exam Constitutional:      General: She is not in acute distress.    Appearance: Normal appearance. She is not ill-appearing, toxic-appearing or diaphoretic.  HENT:     Head: Normocephalic.     Nose: Nose normal.     Mouth/Throat:     Mouth: Mucous membranes are moist.  Eyes:     Conjunctiva/sclera: Conjunctivae normal.  Cardiovascular:     Rate and Rhythm: Normal rate.  Pulmonary:     Effort: Pulmonary effort is normal.  Abdominal:     Palpations: Abdomen is soft.  Genitourinary:    Comments: Deferred; patient performed self-swab for Aptima testing  Musculoskeletal:        General: Normal range of motion.     Cervical back: Normal range of motion and neck supple.   Skin:    General: Skin is warm and dry.  Neurological:     General: No focal deficit present.     Mental Status: She is alert and oriented to person, place, and time.  Psychiatric:  Mood and Affect: Mood normal.        Behavior: Behavior normal.      UC Treatments / Results  Labs (all labs ordered are listed, but only abnormal results are displayed) Labs Reviewed  POCT URINE DIPSTICK - Abnormal; Notable for the following components:      Result Value   Clarity, UA cloudy (*)    Spec Grav, UA >=1.030 (*)    Blood, UA trace-intact (*)    All other components within normal limits  POCT URINE PREGNANCY - Normal  CERVICOVAGINAL ANCILLARY ONLY    EKG   Radiology No results found.  Procedures Procedures (including critical care time)  Medications Ordered in UC Medications - No data to display  Initial Impression / Assessment and Plan / UC Course  I have reviewed the triage vital signs and the nursing notes.  Pertinent labs & imaging results that were available during my care of the patient were reviewed by me and considered in my medical decision making (see chart for details).     Patient presents for due to vaginal irritation and dryness. No dysuria, vaginal discharge, itching or odor. She also is requesting STD testing with no known or expected exposure. Tests obtained today include gonorrhea, chlamydia, trichomonas, bacteria, yeast, HIV, and syphilis. Results are pending. Patient was counseled to abstain from sexual activity until all results have been received, any necessary treatment has been completed, and any symptoms, if present, have resolved. Safe sex practices were discussed and encouraged, including consistent condom use and regular screening with new partners. Patient advised they will only be contacted if any results are positive or require follow-up; otherwise, they may review their results through MyChart.  Today's evaluation has revealed no signs of a  dangerous process. Discussed diagnosis with patient and/or guardian. Patient and/or guardian aware of their diagnosis, possible red flag symptoms to watch out for and need for close follow up. Patient and/or guardian understands verbal and written discharge instructions. Patient and/or guardian comfortable with plan and disposition.  Patient and/or guardian has a clear mental status at this time, good insight into illness (after discussion and teaching) and has clear judgment to make decisions regarding their care  Documentation was completed with the aid of voice recognition software. Transcription may contain typographical errors.   Final Clinical Impressions(s) / UC Diagnoses   Final diagnoses:  Vaginal irritation  Screening examination for STD (sexually transmitted disease)     Discharge Instructions      You were seen today for vaginal irritation and dryness. You also requested STD testing. Samples were taken today to check for gonorrhea, chlamydia, trichomonas, bacterial infection, yeast, HIV, and syphilis. These results are pending. While waiting for your results, it is important to avoid sexual activity until all results are back, any treatment that may be needed is completed, and any symptoms you may have are fully resolved. We discussed safe sex practices, including using condoms every time you have sex and getting regular screening when starting with a new partner. You will only be contacted if any of your test results are positive or require treatment. Otherwise, you can review your results through your MyChart account. Please follow up with your primary care provider or gynecologist if your irritation or dryness persists, worsens, or interferes with your daily life. Go to the emergency room right away if you develop severe abdominal or pelvic pain, fever, heavy vaginal bleeding, or any other concerning symptoms.     ED Prescriptions   None  PDMP not reviewed this encounter.    Iola Lukes, [place] 04/25/24 [phone]

## 2024-04-25 NOTE — ED Triage Notes (Signed)
 Patient reports a sensation of mild irritation and occasional dryness in the vaginal area. Denies discharge, dysuria, urinary frequency, or urgency. Notes localized soreness on the right side of the vulvar region. No specific concern for STI but requests testing (swab and blood work) for reassurance.

## 2024-04-25 NOTE — Discharge Instructions (Addendum)
 You were seen today for vaginal irritation and dryness. You also requested STD testing. Samples were taken today to check for gonorrhea, chlamydia, trichomonas, bacterial infection, yeast, HIV, and syphilis. These results are pending. While waiting for your results, it is important to avoid sexual activity until all results are back, any treatment that may be needed is completed, and any symptoms you may have are fully resolved. We discussed safe sex practices, including using condoms every time you have sex and getting regular screening when starting with a new partner. You will only be contacted if any of your test results are positive or require treatment. Otherwise, you can review your results through your MyChart account. Please follow up with your primary care provider or gynecologist if your irritation or dryness persists, worsens, or interferes with your daily life. Go to the emergency room right away if you develop severe abdominal or pelvic pain, fever, heavy vaginal bleeding, or any other concerning symptoms.

## 2024-04-26 ENCOUNTER — Ambulatory Visit (HOSPITAL_COMMUNITY): Payer: Self-pay

## 2024-04-26 ENCOUNTER — Other Ambulatory Visit (HOSPITAL_COMMUNITY): Payer: Self-pay

## 2024-04-26 LAB — CERVICOVAGINAL ANCILLARY ONLY
Bacterial Vaginitis (gardnerella): NEGATIVE
Candida Glabrata: NEGATIVE
Candida Vaginitis: POSITIVE — AB
Chlamydia: NEGATIVE
Comment: NEGATIVE
Comment: NEGATIVE
Comment: NEGATIVE
Comment: NEGATIVE
Comment: NEGATIVE
Comment: NORMAL
Neisseria Gonorrhea: NEGATIVE
Trichomonas: NEGATIVE

## 2024-04-26 MED ORDER — FLUCONAZOLE 150 MG PO TABS
150.0000 mg | ORAL_TABLET | Freq: Once | ORAL | 0 refills | Status: AC
  Filled 2024-04-26: qty 2, 2d supply, fill #0

## 2024-04-27 ENCOUNTER — Other Ambulatory Visit (HOSPITAL_COMMUNITY): Payer: Self-pay

## 2024-05-10 ENCOUNTER — Ambulatory Visit
Admission: EM | Admit: 2024-05-10 | Discharge: 2024-05-10 | Disposition: A | Discharge status: Home / Self Care | Attending: Family Medicine | Admitting: Family Medicine

## 2024-05-10 ENCOUNTER — Encounter: Payer: Self-pay | Admitting: Emergency Medicine

## 2024-05-10 DIAGNOSIS — R3915 Urgency of urination: Secondary | ICD-10-CM | POA: Diagnosis not present

## 2024-05-10 DIAGNOSIS — N926 Irregular menstruation, unspecified: Secondary | ICD-10-CM | POA: Diagnosis not present

## 2024-05-10 DIAGNOSIS — L03313 Cellulitis of chest wall: Secondary | ICD-10-CM | POA: Diagnosis not present

## 2024-05-10 DIAGNOSIS — J069 Acute upper respiratory infection, unspecified: Secondary | ICD-10-CM | POA: Diagnosis not present

## 2024-05-10 LAB — POCT URINE DIPSTICK
Bilirubin, UA: NEGATIVE
Glucose, UA: NEGATIVE mg/dL
Ketones, POC UA: NEGATIVE mg/dL
Leukocytes, UA: NEGATIVE
Nitrite, UA: NEGATIVE
POC PROTEIN,UA: NEGATIVE
Spec Grav, UA: 1.02 (ref 1.010–1.025)
Urobilinogen, UA: 0.2 U/dL
pH, UA: 6 (ref 5.0–8.0)

## 2024-05-10 LAB — POCT URINE PREGNANCY: Preg Test, Ur: NEGATIVE

## 2024-05-10 LAB — POC COVID19/FLU A&B COMBO
Covid Antigen, POC: NEGATIVE
Influenza A Antigen, POC: NEGATIVE
Influenza B Antigen, POC: NEGATIVE

## 2024-05-10 MED ORDER — IBUPROFEN 600 MG PO TABS: 600.0000 mg | ORAL_TABLET | Freq: Three times a day (TID) | ORAL | 0 refills | Status: DC | PRN

## 2024-05-10 MED ORDER — CEPHALEXIN 500 MG PO CAPS: 500.0000 mg | ORAL_CAPSULE | Freq: Two times a day (BID) | ORAL | 0 refills | Status: AC

## 2024-05-10 MED ORDER — BENZONATATE 100 MG PO CAPS: 100.0000 mg | ORAL_CAPSULE | Freq: Three times a day (TID) | ORAL | 0 refills | Status: DC | PRN

## 2024-05-10 NOTE — ED Triage Notes (Signed)
 Pt here with several complaints: sore throat, nasal congestion, dry cough, and fevers that started yesterday. Unsure of max temp. Taking robitussin and tylenol with little relief. Pt notes she was dizzy when she first woke up yesterday morning, but it resolved on its own after pt took a nap. Reports diarrhea started this morning - 3 loose stools total. Denies nausea and vomiting.   Pt also reports a painful bump on her R breast. States she had a nipple ring in place that she removed 3 months ago. Area has been irritated and swollen x1 week.   Also reports urinary frequency and foul odor to urine since yesterday. Denies dysuria, hematuria, abdominal pain, and flank pain.

## 2024-05-10 NOTE — Discharge Instructions (Addendum)
 The COVID and flu tests were negative  Pregnancy test was negative  Urinalysis did not show sign of infection.  Urine culture is still sent, and staff will call you if it looks like there is a urinary infection.  Cephalexin  500 mg --1 tablet by mouth 2 times daily for 7 days.  Take benzonatate 100 mg, 1 tab every 8 hours as needed for cough.  Take ibuprofen  600 mg--1 tab every 8 hours as needed for pain.

## 2024-05-10 NOTE — ED Provider Notes (Signed)
 [name]    CSN: [id number] Arrival date & time: 05/10/24  1544      History   Chief Complaint Chief Complaint  Patient presents with   Diarrhea   Dizziness   Cough   Breast Problem    HPI Laura [name] is a 23 y.o. female.    Diarrhea Dizziness Associated symptoms: diarrhea   Cough  Here for nasal congestion and rhinorrhea and cough.  Symptoms began 2 days ago on October 14.  She has also had some loose stools several times today.  No abdominal cramping.  She thinks she has had some fever but has not gotten to measure it.  No vomiting  She also felt a little dizzy this morning.  Since the illness started she has had some strong smelling urine and urinary frequency.  No dysuria  Also she has a painful bump on her right breast where she had had a nipple ring in place.  She removed it 3 months ago but for the last week it has been swollen more and irritated.  NKDA  Last menstrual cycle was August 25.  She did just stop the birth control pills. Past Medical History:  Diagnosis Date   Migraines     Patient Active Problem List   Diagnosis Date Noted   Strain of back 03/29/2024   Rhinorrhea 03/29/2024   Routine general medical examination at a health care facility 10/06/2023   Obesity (BMI 30-39.9) 10/06/2023   Gastroesophageal reflux disease 07/04/2023   Birth control counseling 07/08/2022   Chronic migraine without aura without status migrainosus, not intractable 07/08/2022    History reviewed. No pertinent surgical history.  OB History   No obstetric history on file.      Home Medications    Prior to Admission medications   Medication Sig Start Date End Date Taking? Authorizing Provider  Acetaminophen (TYLENOL PO) Take by mouth as needed.   Yes [provider]  benzonatate (TESSALON) 100 MG capsule Take 1 capsule (100 mg total) by mouth 3 (three) times daily as needed for cough. 05/10/24  Yes Vonna Sharlet POUR, MD   cephALEXin  (KEFLEX ) 500 MG capsule Take 1 capsule (500 mg total) by mouth 2 (two) times daily for 7 days. 05/10/24 05/17/24 Yes Vonna Sharlet POUR, MD  ibuprofen  (ADVIL ) 600 MG tablet Take 1 tablet (600 mg total) by mouth every 8 (eight) hours as needed (pain). 05/10/24  Yes Vonna Sharlet POUR, MD  lidocaine  (LIDODERM ) 5 % Place 1 patch onto the skin daily. Remove & Discard patch within 12 hours or as directed by MD Patient not taking: Reported on 05/10/2024 03/29/24   McElwee, Lauren A, NP  methocarbamol  (ROBAXIN ) 500 MG tablet Take 1 tablet (500 mg total) by mouth 2 (two) times daily. Patient not taking: Reported on 05/10/2024 03/18/24   Zelaya, Oscar A, PA-C  Norethindrone  Acetate-Ethinyl Estradiol  (LOESTRIN ) 1.5-30 MG-MCG tablet Take 1 tablet by mouth daily. Patient not taking: Reported on 05/10/2024 05/04/23   McElwee, Tinnie LABOR, NP  Rimegepant Sulfate  (NURTEC) 75 MG TBDP Take 1 tablet (75 mg total) by mouth every other day as needed (migraine). Patient not taking: Reported on 12/14/2023 07/04/23   Nedra Tinnie LABOR, NP  SUMAtriptan  (IMITREX ) 25 MG tablet Take 1 tablet as needed for migraine. May repeat in 2 hours if headache persists or recurs. Patient not taking: No sig reported 05/04/23   Nedra Tinnie LABOR, NP    Family History Family History  Problem Relation Age of Onset  Hypertension Mother     Social History Social History   Tobacco Use   Smoking status: Former    Types: Cigarettes    Passive exposure: Current   Smokeless tobacco: Never  Vaping Use   Vaping status: Never Used  Substance Use Topics   Alcohol use: Yes    Alcohol/week: 2.0 standard drinks of alcohol    Types: 2 Shots of liquor per week    Comment: Occassionally.   Drug use: Yes    Types: Marijuana    Comment: twice a week     Allergies   Patient has no known allergies.   Review of Systems Review of Systems  Respiratory:  Positive for cough.   Gastrointestinal:  Positive for diarrhea.   Neurological:  Positive for dizziness.     Physical Exam Triage Vital Signs ED Triage Vitals  Encounter Vitals Group     BP 05/10/24 1604 103/72     Girls Systolic BP Percentile --      Girls Diastolic BP Percentile --      Boys Systolic BP Percentile --      Boys Diastolic BP Percentile --      Pulse Rate 05/10/24 1604 87     Resp 05/10/24 1604 16     Temp 05/10/24 1604 98.4 F (36.9 C)     Temp Source 05/10/24 1604 Oral     SpO2 05/10/24 1604 96 %     Weight --      Height --      Head Circumference --      Peak Flow --      Pain Score 05/10/24 1605 9     Pain Loc --      Pain Education --      Exclude from Growth Chart --    No data found.  Updated Vital Signs BP 103/72 (BP Location: Left Arm)   Pulse 87   Temp 98.4 F (36.9 C) (Oral)   Resp 16   LMP 03/19/2024 (Exact Date)   SpO2 96%   Visual Acuity Right Eye Distance:   Left Eye Distance:   Bilateral Distance:    Right Eye Near:   Left Eye Near:    Bilateral Near:     Physical Exam Vitals reviewed.  Constitutional:      General: She is not in acute distress.    Appearance: She is not toxic-appearing.  HENT:     Right Ear: Tympanic membrane and ear canal normal.     Left Ear: Tympanic membrane and ear canal normal.     Nose: Congestion present.     Mouth/Throat:     Mouth: Mucous membranes are moist.     Comments: There is clear mucus draining in the oropharynx Eyes:     Extraocular Movements: Extraocular movements intact.     Conjunctiva/sclera: Conjunctivae normal.     Pupils: Pupils are equal, round, and reactive to light.  Cardiovascular:     Rate and Rhythm: Normal rate and regular rhythm.     Heart sounds: No murmur heard. Pulmonary:     Effort: No respiratory distress.     Breath sounds: No stridor. No wheezing, rhonchi or rales.  Chest:     Comments: There is an area of erythema and induration about 1.5 cm in diameter over her right areola and nipple.  It is starting to point  possibly on the lateral side. Abdominal:     Palpations: Abdomen is soft.     Tenderness: There  is no abdominal tenderness.  Musculoskeletal:     Cervical back: Neck supple.  Lymphadenopathy:     Cervical: No cervical adenopathy.  Skin:    Capillary Refill: Capillary refill takes less than 2 seconds.     Coloration: Skin is not jaundiced or pale.  Neurological:     General: No focal deficit present.     Mental Status: She is alert and oriented to person, place, and time.  Psychiatric:        Behavior: Behavior normal.      UC Treatments / Results  Labs (all labs ordered are listed, but only abnormal results are displayed) Labs Reviewed  POCT URINE DIPSTICK - Abnormal; Notable for the following components:      Result Value   Color, UA light yellow (*)    Clarity, UA cloudy (*)    Blood, UA trace-intact (*)    All other components within normal limits  POC COVID19/FLU A&B COMBO - Normal  POCT URINE PREGNANCY - Normal  URINE CULTURE    EKG   Radiology No results found.  Procedures Procedures (including critical care time)  Medications Ordered in UC Medications - No data to display  Initial Impression / Assessment and Plan / UC Course  I have reviewed the triage vital signs and the nursing notes.  Pertinent labs & imaging results that were available during my care of the patient were reviewed by me and considered in my medical decision making (see chart for details).     COVID and flu tests are negative. UPT is negative UA shows a little bit of blood but no white cells and no nitrites.  Urine culture is sent and we will notify her if she needs a different antibiotic for her UTI.  Keflex  is sent in for the cellulitis of her breast.  She will do warm soaks  Tessalon Perles are sent in for the cough and ibuprofen  is sent in for pain. Final Clinical Impressions(s) / UC Diagnoses   Final diagnoses:  Viral URI  Irregular menses  Cellulitis of chest wall   Urinary urgency     Discharge Instructions      The COVID and flu tests were negative  Pregnancy test was negative  Urinalysis did not show sign of infection.  Urine culture is still sent, and staff will call you if it looks like there is a urinary infection.  Cephalexin  500 mg --1 tablet by mouth 2 times daily for 7 days.  Take benzonatate 100 mg, 1 tab every 8 hours as needed for cough.  Take ibuprofen  600 mg--1 tab every 8 hours as needed for pain.      ED Prescriptions     Medication Sig Dispense Auth. Provider   cephALEXin  (KEFLEX ) 500 MG capsule Take 1 capsule (500 mg total) by mouth 2 (two) times daily for 7 days. 14 capsule Ixel Boehning K, MD   benzonatate (TESSALON) 100 MG capsule Take 1 capsule (100 mg total) by mouth 3 (three) times daily as needed for cough. 21 capsule Vonna Sharlet POUR, MD   ibuprofen  (ADVIL ) 600 MG tablet Take 1 tablet (600 mg total) by mouth every 8 (eight) hours as needed (pain). 15 tablet [place]e Agent K, MD      PDMP not reviewed this encounter.   Vonna Sharlet POUR, MD 05/10/24 [phone]

## 2024-05-11 LAB — URINE CULTURE: Culture: NO GROWTH

## 2024-05-22 ENCOUNTER — Ambulatory Visit: Payer: Self-pay

## 2024-05-22 NOTE — Telephone Encounter (Signed)
 Noted. Patient sch for 10/30

## 2024-05-22 NOTE — Telephone Encounter (Signed)
 FYI Only or Action Required?: FYI only for provider.  Patient was last seen in primary care on 03/29/2024 by Nedra Tinnie LABOR, NP.  Called Nurse Triage reporting Menstrual Problem.  Symptoms began several months ago.  Interventions attempted: Nothing.  Symptoms are: unchanged.  Triage Disposition: See PCP Within 2 Weeks  Patient/caregiver understands and will follow disposition?: Yes      Copied from CRM [id number]. Topic: Clinical - Red Word Triage >> May 22, 2024  2:05 PM Alexandria E wrote: [facility] that prompted transfer to Nurse Triage: Patient has not had her cycle since August of this year, noticed spotting for one day last month with irregular bleeding. Reason for Disposition  [1] Missed 2 or more periods in a row AND [2] cause is not known  Answer Assessment - Initial Assessment Questions Patient states she has some spotting one day last month.   1. LMP:  When did your last menstrual period begin?     03/19/2024 2. DAYS LATE: How many days late is your period?     2 months late  3. REGULARITY: How regular are your periods?     Regular normally  4. PREGNANCY: Is there any chance you are pregnant? (e.g., unprotected intercourse, missed birth control pill, broken condom) Have you used a home pregnancy test?     Yes took an at home preg test that was negative. Also took a preg test 2 weeks ago at [facility] that was also negative 5. BREASTFEEDING: Are you breastfeeding?     Denies  6. BIRTH CONTROL PILLS: Are you taking birth control pills, or have you stopped recently?     No currently stopped 3 months ago  7. LONG-ACTING CONTRACEPTION: Has your doctor given you a shot to prevent pregnancy? (e.g., Depo-Provera  injection) Do you have an intrauterine device (IUD)?     No  8. CAUSE: What do you think caused the missed period? (e.g., stress, rapid weight loss, excessive exercise)     Unsure  9. OTHER SYMPTOMS: Do you have any other symptoms? (e.g., abdomen  pain)     Denies  Protocols used: Menstrual Period - Missed or Late-A-AH

## 2024-05-24 ENCOUNTER — Ambulatory Visit: Admitting: Nurse Practitioner

## 2024-05-24 ENCOUNTER — Encounter: Payer: Self-pay | Admitting: Nurse Practitioner

## 2024-05-24 VITALS — BP 108/64 | HR 86 | Temp 97.0°F | Ht 62.0 in | Wt 223.0 lb

## 2024-05-24 DIAGNOSIS — N912 Amenorrhea, unspecified: Secondary | ICD-10-CM | POA: Diagnosis not present

## 2024-05-24 LAB — CBC WITH DIFFERENTIAL/PLATELET
Basophils Absolute: 0 K/uL (ref 0.0–0.1)
Basophils Relative: 0.6 % (ref 0.0–3.0)
Eosinophils Absolute: 0 K/uL (ref 0.0–0.7)
Eosinophils Relative: 0.8 % (ref 0.0–5.0)
HCT: 39.7 % (ref 36.0–46.0)
Hemoglobin: 13.4 g/dL (ref 12.0–15.0)
Lymphocytes Relative: 65.2 % — ABNORMAL HIGH (ref 12.0–46.0)
Lymphs Abs: 2 K/uL (ref 0.7–4.0)
MCHC: 33.7 g/dL (ref 30.0–36.0)
MCV: 83.8 fl (ref 78.0–100.0)
Monocytes Absolute: 0.2 K/uL (ref 0.1–1.0)
Monocytes Relative: 5.9 % (ref 3.0–12.0)
Neutro Abs: 0.8 K/uL — ABNORMAL LOW (ref 1.4–7.7)
Neutrophils Relative %: 27.5 % — ABNORMAL LOW (ref 43.0–77.0)
Platelets: 166 K/uL (ref 150.0–400.0)
RBC: 4.73 Mil/uL (ref 3.87–5.11)
RDW: 13.4 % (ref 11.5–15.5)
WBC: 3 K/uL — ABNORMAL LOW (ref 4.0–10.5)

## 2024-05-24 LAB — COMPREHENSIVE METABOLIC PANEL WITH GFR
ALT: 10 U/L (ref 0–35)
AST: 16 U/L (ref 0–37)
Albumin: 4.1 g/dL (ref 3.5–5.2)
Alkaline Phosphatase: 60 U/L (ref 39–117)
BUN: 14 mg/dL (ref 6–23)
CO2: 25 meq/L (ref 19–32)
Calcium: 8.8 mg/dL (ref 8.4–10.5)
Chloride: 106 meq/L (ref 96–112)
Creatinine, Ser: 0.73 mg/dL (ref 0.40–1.20)
GFR: 116.36 mL/min (ref >60.00)
Glucose, Bld: 101 mg/dL — ABNORMAL HIGH (ref 70–99)
Potassium: 3.6 meq/L (ref 3.5–5.1)
Sodium: 140 meq/L (ref 135–145)
Total Bilirubin: 0.4 mg/dL (ref 0.2–1.2)
Total Protein: 7 g/dL (ref 6.0–8.3)

## 2024-05-24 LAB — HCG, QUANTITATIVE, PREGNANCY: Quantitative HCG: <0.6 m[IU]/mL

## 2024-05-24 LAB — HEMOGLOBIN A1C: Hgb A1c MFr Bld: 6.4 % (ref 4.6–6.5)

## 2024-05-24 LAB — TESTOSTERONE: Testosterone: 53.84 ng/dL — ABNORMAL HIGH (ref 15.00–40.00)

## 2024-05-24 LAB — TSH: TSH: 1.15 u[IU]/mL (ref 0.35–5.50)

## 2024-05-24 NOTE — Patient Instructions (Signed)
 It was great to see you!  We are checking your labs today and will let you know the results via mychart/phone.   Let's follow-up based on results   Take care,  Tinnie Harada, NP

## 2024-05-24 NOTE — Assessment & Plan Note (Signed)
 Irregular cycles have occurred for the last 2 months with negative pregnancy tests. Check TSH, CMP, CBC, A1c, FSH, LH, estradiol , and testosterone  today. Depending on labs, may need pelvic ultrasound and GYN referral.

## 2024-05-24 NOTE — Progress Notes (Signed)
 Acute Office Visit  Subjective:     Patient ID: Laura [name], female    DOB: 03-12-2001, 23 y.o.   MRN: [id number]  Chief Complaint  Patient presents with   Amenorrhea    Started bleeding some yesterday, no pain, some cramps    HPI Discussed the use of AI scribe software for clinical note transcription with the patient, who gave verbal consent to proceed.  History of Present Illness   Laura [name] is a 23 year old female who presents with irregular menstrual cycles.  Her last regular menstrual cycle was on March 19, 2024, with normal cycles in June, July, and August. She experienced spotting on April 25, 2024, and began a lighter than usual cycle yesterday, with minimal bleeding today.  She discontinued birth control pills around June 2025 after a couple of months of use.. She denies any prior irregular periods.  She experiences cramping but no stomach pain, chest pain, or shortness of breath. She has not been exercising recently, except for stretching, and reports no significant dietary changes. She has gained approximately five pounds since her last visit.  She has a history of chin hair growth, which she describes as always present.     ROS See pertinent positives and negatives per HPI.     Objective:    BP 108/64 (BP Location: Left Arm, Patient Position: Sitting, Cuff Size: Normal)   Pulse 86   Temp (!) 97 F (36.1 C)   Ht 5' 2 (1.575 m)   Wt 223 lb (101.2 kg)   LMP 03/19/2024 (Exact Date)   SpO2 97%   BMI 40.79 kg/m  BP Readings from Last 3 Encounters:  05/24/24 108/64  05/10/24 103/72  04/25/24 114/72   Wt Readings from Last 3 Encounters:  05/24/24 223 lb (101.2 kg)  04/25/24 217 lb (98.4 kg)  03/29/24 217 lb 9.6 oz (98.7 kg)      Physical Exam Vitals and nursing note reviewed.  Constitutional:      General: She is not in acute distress.    Appearance: Normal appearance.  HENT:     Head: Normocephalic.  Eyes:     Conjunctiva/sclera:  Conjunctivae normal.  Cardiovascular:     Rate and Rhythm: Normal rate and regular rhythm.     Pulses: Normal pulses.     Heart sounds: Normal heart sounds.  Pulmonary:     Effort: Pulmonary effort is normal.     Breath sounds: Normal breath sounds.  Abdominal:     Palpations: Abdomen is soft.     Tenderness: There is no abdominal tenderness.  Musculoskeletal:     Cervical back: Normal range of motion.  Skin:    General: Skin is warm.  Neurological:     General: No focal deficit present.     Mental Status: She is alert and oriented to person, place, and time.  Psychiatric:        Mood and Affect: Mood normal.        Behavior: Behavior normal.        Thought Content: Thought content normal.        Judgment: Judgment normal.       Assessment & Plan:   Problem List Items Addressed This Visit       Other   Amenorrhea - Primary   Irregular cycles have occurred for the last 2 months with negative pregnancy tests. Check TSH, CMP, CBC, A1c, FSH, LH, estradiol , and testosterone  today. Depending on labs, may need pelvic  ultrasound and GYN referral.       Relevant Orders   CBC with Differential/Platelet   Comprehensive metabolic panel with GFR   TSH   FSH/LH   Testosterone    Estradiol    B-HCG Quant   Hemoglobin A1c    No orders of the defined types were placed in this encounter.   Return if symptoms worsen or fail to improve.  Tinnie DELENA Harada, NP

## 2024-05-25 ENCOUNTER — Ambulatory Visit: Payer: Self-pay | Admitting: Nurse Practitioner

## 2024-05-25 ENCOUNTER — Other Ambulatory Visit (HOSPITAL_COMMUNITY): Payer: Self-pay

## 2024-05-25 DIAGNOSIS — D72819 Decreased white blood cell count, unspecified: Secondary | ICD-10-CM

## 2024-05-25 DIAGNOSIS — N912 Amenorrhea, unspecified: Secondary | ICD-10-CM

## 2024-05-25 LAB — FSH/LH
FSH: 7 m[IU]/mL
LH: 12.4 m[IU]/mL

## 2024-05-25 LAB — ESTRADIOL: Estradiol: 46 pg/mL

## 2024-05-25 MED ORDER — METFORMIN HCL ER 500 MG PO TB24
500.0000 mg | ORAL_TABLET | Freq: Every day | ORAL | 0 refills | Status: AC
  Filled 2024-05-25: qty 90, 90d supply, fill #0

## 2024-05-31 ENCOUNTER — Ambulatory Visit
Admission: RE | Admit: 2024-05-31 | Discharge: 2024-05-31 | Disposition: A | Discharge status: Home / Self Care | Source: Ambulatory Visit | Attending: Nurse Practitioner | Admitting: Nurse Practitioner

## 2024-05-31 DIAGNOSIS — N926 Irregular menstruation, unspecified: Secondary | ICD-10-CM | POA: Diagnosis not present

## 2024-05-31 DIAGNOSIS — N912 Amenorrhea, unspecified: Secondary | ICD-10-CM

## 2024-06-03 ENCOUNTER — Ambulatory Visit (HOSPITAL_COMMUNITY)
Admission: EM | Admit: 2024-06-03 | Discharge: 2024-06-03 | Disposition: A | Discharge status: Home / Self Care | Attending: Internal Medicine | Admitting: Internal Medicine

## 2024-06-03 ENCOUNTER — Encounter (HOSPITAL_COMMUNITY): Payer: Self-pay | Admitting: Emergency Medicine

## 2024-06-03 DIAGNOSIS — H6123 Impacted cerumen, bilateral: Secondary | ICD-10-CM | POA: Diagnosis not present

## 2024-06-03 DIAGNOSIS — R112 Nausea with vomiting, unspecified: Secondary | ICD-10-CM

## 2024-06-03 DIAGNOSIS — J069 Acute upper respiratory infection, unspecified: Secondary | ICD-10-CM

## 2024-06-03 LAB — POC COVID19/FLU A&B COMBO
Covid Antigen, POC: NEGATIVE
Influenza A Antigen, POC: NEGATIVE
Influenza B Antigen, POC: NEGATIVE

## 2024-06-03 MED ORDER — PROMETHAZINE-DM 6.25-15 MG/5ML PO SYRP: 5.0000 mL | ORAL_SOLUTION | Freq: Every evening | ORAL | 0 refills | Status: DC | PRN

## 2024-06-03 MED ORDER — IBUPROFEN 800 MG PO TABS
800.0000 mg | ORAL_TABLET | Freq: Once | ORAL | Status: AC
Start: 2024-06-03 — End: 2024-06-03
  Administered 2024-06-03: 800 mg via ORAL

## 2024-06-03 MED ORDER — ONDANSETRON 4 MG PO TBDP
4.0000 mg | ORAL_TABLET | Freq: Once | ORAL | Status: AC
Start: 2024-06-03 — End: 2024-06-03
  Administered 2024-06-03: 4 mg via ORAL

## 2024-06-03 MED ORDER — IBUPROFEN 800 MG PO TABS
ORAL_TABLET | ORAL | Status: AC
  Filled 2024-06-03: qty 1

## 2024-06-03 MED ORDER — ONDANSETRON 4 MG PO TBDP
ORAL_TABLET | ORAL | Status: AC
  Filled 2024-06-03: qty 1

## 2024-06-03 MED ORDER — ONDANSETRON 4 MG PO TBDP: 4.0000 mg | ORAL_TABLET | Freq: Three times a day (TID) | ORAL | 0 refills | Status: DC | PRN

## 2024-06-03 NOTE — Discharge Instructions (Signed)
 You have a viral illness which will improve on its own with rest, fluids, and medications to help with your symptoms.  Tylenol, guaifenesin (plain mucinex), and saline nasal sprays may help relieve symptoms.   Two teaspoons of honey in 1 cup of warm water every 4-6 hours may help with throat pains.  Zofran  4mg  under the tongue every 8 hours as needed for nausea/vomiting.   Start sipping on fluids (water/gatorade/pedialyte), then once you can tolerate fluids, start eating bland foods like bananas, rice, toast, applesauce. Then you can go back to eating whatever you'd like :)   Humidifier in room at nighttime may help soothe cough (clean well daily).   Take Promethazine DM cough medication to help with your cough at nighttime so that you are able to sleep. Do not drive, drink alcohol, or go to work while taking this medication since it can make you sleepy. Only take this at nighttime.   For chest pain, shortness of breath, inability to keep food or fluids down without vomiting, fever that does not respond to tylenol or motrin , or any other severe symptoms, please go to the ER for further evaluation. Return to urgent care as needed, otherwise follow-up with PCP.

## 2024-06-03 NOTE — ED Triage Notes (Signed)
 Pt c/o headache, coughing, sneezing, nasal congestion and body ache started on Thursday. Pt reports Vomiting started yesterday. Ibuprofen  600 mg and cold and flu medication.

## 2024-06-04 NOTE — ED Provider Notes (Signed)
 [facility]    CSN: [id number] Arrival date & time: 06/03/24  1552      History   Chief Complaint Chief Complaint  Patient presents with   Headache   Cough   Emesis    HPI Laura [name] is a 23 y.o. female.   Laura [name] is a 23 y.o. female presenting for chief complaint of headache, cough, congestion, nausea, vomiting, body aches, and fatigue that started 3 days ago on Thursday May 31, 2024. Her mom was recently sick with similar symptoms. Reports chills without documented fever at home. Cough is productive with yellow/white sputum. She has had a few episodes of non-bloody/non-bilious emesis over the last 24-48 hours, vomiting has improved today. She is tolerating fluids but still feels nauseous. BP slightly softer than baseline at 95/55, she denies dizziness, lightheadedness, syncope, heart palpitations, abdominal pain, urinary symptoms, chest pain, shortness of breath, and diarrhea. Smokes marijuana intermittently, denies vaping/tobacco use. No history of asthma. Taking OTC medicines without relief.  She denies chance of pregnancy, currently on her menstrual cycle.    Headache Associated symptoms: cough and vomiting   Cough Associated symptoms: headaches   Emesis Associated symptoms: cough and headaches     Past Medical History:  Diagnosis Date   Migraines     Patient Active Problem List   Diagnosis Date Noted   Amenorrhea 05/24/2024   Strain of back 03/29/2024   Rhinorrhea 03/29/2024   Routine general medical examination at a health care facility 10/06/2023   Obesity (BMI 30-39.9) 10/06/2023   Gastroesophageal reflux disease 07/04/2023   Birth control counseling 07/08/2022   Chronic migraine without aura without status migrainosus, not intractable 07/08/2022    History reviewed. No pertinent surgical history.  OB History   No obstetric history on file.      Home Medications    Prior to Admission medications   Medication Sig Start  Date End Date Taking? Authorizing Provider  ondansetron  (ZOFRAN -ODT) 4 MG disintegrating tablet Take 1 tablet (4 mg total) by mouth every 8 (eight) hours as needed for nausea or vomiting. 06/03/24  Yes Enedelia Dorna HERO, FNP  promethazine-dextromethorphan (PROMETHAZINE-DM) 6.25-15 MG/5ML syrup Take 5 mLs by mouth at bedtime as needed for cough. 06/03/24  Yes Enedelia Dorna HERO, FNP  Acetaminophen (TYLENOL PO) Take by mouth as needed.    [provider]  benzonatate (TESSALON) 100 MG capsule Take 1 capsule (100 mg total) by mouth 3 (three) times daily as needed for cough. 05/10/24   Vonna Sharlet POUR, MD  ibuprofen  (ADVIL ) 600 MG tablet Take 1 tablet (600 mg total) by mouth every 8 (eight) hours as needed (pain). 05/10/24   Banister, Pamela K, MD  metFORMIN (GLUCOPHAGE-XR) 500 MG 24 hr tablet Take 1 tablet (500 mg total) by mouth daily with breakfast. 05/25/24   McElwee, Lauren A, NP  methocarbamol  (ROBAXIN ) 500 MG tablet Take 1 tablet (500 mg total) by mouth 2 (two) times daily. Patient not taking: Reported on 05/24/2024 03/18/24   Zelaya, Oscar A, PA-C  Norethindrone  Acetate-Ethinyl Estradiol  (LOESTRIN ) 1.5-30 MG-MCG tablet Take 1 tablet by mouth daily. Patient not taking: Reported on 05/24/2024 05/04/23   McElwee, Tinnie LABOR, NP  Rimegepant Sulfate  (NURTEC) 75 MG TBDP Take 1 tablet (75 mg total) by mouth every other day as needed (migraine). Patient not taking: Reported on 05/24/2024 07/04/23   Nedra Tinnie A, NP  SUMAtriptan  (IMITREX ) 25 MG tablet Take 1 tablet as needed for migraine. May repeat in 2 hours if headache  persists or recurs. Patient not taking: Reported on 05/24/2024 05/04/23   Nedra Tinnie LABOR, NP    Family History Family History  Problem Relation Age of Onset   Hypertension Mother     Social History Social History   Tobacco Use   Smoking status: Former    Types: Cigarettes    Passive exposure: Current   Smokeless tobacco: Never  Vaping Use   Vaping  status: Never Used  Substance Use Topics   Alcohol use: Yes    Alcohol/week: 2.0 standard drinks of alcohol    Types: 2 Shots of liquor per week    Comment: Occassionally.   Drug use: Yes    Types: Marijuana    Comment: twice a week     Allergies   Patient has no known allergies.   Review of Systems Review of Systems  Respiratory:  Positive for cough.   Gastrointestinal:  Positive for vomiting.  Neurological:  Positive for headaches.  Per HPI   Physical Exam Triage Vital Signs ED Triage Vitals  Encounter Vitals Group     BP 06/03/24 1636 (!) 95/55     Girls Systolic BP Percentile --      Girls Diastolic BP Percentile --      Boys Systolic BP Percentile --      Boys Diastolic BP Percentile --      Pulse Rate 06/03/24 1636 (!) 105     Resp 06/03/24 1636 18     Temp 06/03/24 1636 99.5 F (37.5 C)     Temp Source 06/03/24 1636 Oral     SpO2 06/03/24 1636 97 %     Weight --      Height --      Head Circumference --      Peak Flow --      Pain Score 06/03/24 1635 9     Pain Loc --      Pain Education --      Exclude from Growth Chart --    No data found.  Updated Vital Signs BP (!) 95/55 (BP Location: Right Arm)   Pulse (!) 105   Temp 99.5 F (37.5 C) (Oral)   Resp 18   LMP 05/23/2024 (Exact Date)   SpO2 97%   Visual Acuity Right Eye Distance:   Left Eye Distance:   Bilateral Distance:    Right Eye Near:   Left Eye Near:    Bilateral Near:     Physical Exam Vitals and nursing note reviewed.  Constitutional:      Appearance: She is not ill-appearing or toxic-appearing.  HENT:     Head: Normocephalic and atraumatic.     Right Ear: Hearing, tympanic membrane, ear canal and external ear normal. There is impacted cerumen.     Left Ear: Hearing, tympanic membrane, ear canal and external ear normal. There is impacted cerumen.     Nose: Congestion present.     Mouth/Throat:     Lips: Pink.     Mouth: Mucous membranes are moist. No injury or oral  lesions.     Dentition: Normal dentition.     Tongue: No lesions.     Pharynx: Oropharynx is clear. Uvula midline. No pharyngeal swelling, oropharyngeal exudate, posterior oropharyngeal erythema, uvula swelling or postnasal drip.     Tonsils: No tonsillar exudate.  Eyes:     General: Lids are normal. Vision grossly intact. Gaze aligned appropriately.     Extraocular Movements: Extraocular movements intact.     Conjunctiva/sclera: Conjunctivae  normal.  Neck:     Trachea: Trachea and phonation normal.  Cardiovascular:     Rate and Rhythm: Normal rate and regular rhythm.     Heart sounds: Normal heart sounds, S1 normal and S2 normal.  Pulmonary:     Effort: Pulmonary effort is normal. No respiratory distress.     Breath sounds: Normal breath sounds and air entry. No wheezing, rhonchi or rales.     Comments: Speaking in full sentences without difficulty.  Chest:     Chest wall: No tenderness.  Abdominal:     General: Bowel sounds are normal.     Palpations: Abdomen is soft.     Tenderness: There is no abdominal tenderness. There is no right CVA tenderness, left CVA tenderness or guarding.  Musculoskeletal:     Cervical back: Neck supple.  Lymphadenopathy:     Cervical: No cervical adenopathy.  Skin:    General: Skin is warm and dry.     Capillary Refill: Capillary refill takes less than 2 seconds.     Findings: No rash.  Neurological:     General: No focal deficit present.     Mental Status: She is alert and oriented to person, place, and time. Mental status is at baseline.     Cranial Nerves: No dysarthria or facial asymmetry.  Psychiatric:        Mood and Affect: Mood normal.        Speech: Speech normal.        Behavior: Behavior normal.        Thought Content: Thought content normal.        Judgment: Judgment normal.      UC Treatments / Results  Labs (all labs ordered are listed, but only abnormal results are displayed) Labs Reviewed  POC COVID19/FLU A&B COMBO     EKG   Radiology No results found.  Procedures Procedures (including critical care time)  Medications Ordered in UC Medications  ondansetron  (ZOFRAN -ODT) disintegrating tablet 4 mg (4 mg Oral Given 06/03/24 1732)  ibuprofen  (ADVIL ) tablet 800 mg (800 mg Oral Given 06/03/24 1732)    Initial Impression / Assessment and Plan / UC Course  I have reviewed the triage vital signs and the nursing notes.  Pertinent labs & imaging results that were available during my care of the patient were reviewed by me and considered in my medical decision making (see chart for details).   1. Viral URI with cough, nausea and vomiting Suspect viral URI, viral syndrome.  Strep/viral testing: POC COVID and flu testing are negative.   Physical exam findings reassuring, vital signs hemodynamically stable, and lungs clear, therefore deferred imaging of the chest.  Advised supportive care/prescriptions for symptomatic relief as outlined in AVS.   Zofran  and ibuprofen  in clinic with significant improvement in nausea and headache prior to discharge.   2. Bilateral impacted cerumen Both ear(s) cleaned with ear lavage to remove ear wax impactions bilaterally by nursing staff.  Reassessment shows normal tympanic membrane(s) without signs of AOM/AOE.  Patient may use debrox ear drops at home as needed for wax removal and has been advised to avoid using Q-tips.   Counseled patient on potential for adverse effects with medications prescribed/recommended today, strict ER and return-to-clinic precautions discussed, patient verbalized understanding.    Final Clinical Impressions(s) / UC Diagnoses   Final diagnoses:  Viral URI with cough  Bilateral impacted cerumen  Nausea and vomiting, unspecified vomiting type     Discharge Instructions  You have a viral illness which will improve on its own with rest, fluids, and medications to help with your symptoms.  Tylenol, guaifenesin (plain mucinex), and  saline nasal sprays may help relieve symptoms.   Two teaspoons of honey in 1 cup of warm water every 4-6 hours may help with throat pains.  Zofran  4mg  under the tongue every 8 hours as needed for nausea/vomiting.   Start sipping on fluids (water/gatorade/pedialyte), then once you can tolerate fluids, start eating bland foods like bananas, rice, toast, applesauce. Then you can go back to eating whatever you'd like :)   Humidifier in room at nighttime may help soothe cough (clean well daily).   Take Promethazine DM cough medication to help with your cough at nighttime so that you are able to sleep. Do not drive, drink alcohol, or go to work while taking this medication since it can make you sleepy. Only take this at nighttime.   For chest pain, shortness of breath, inability to keep food or fluids down without vomiting, fever that does not respond to tylenol or motrin , or any other severe symptoms, please go to the ER for further evaluation. Return to urgent care as needed, otherwise follow-up with PCP.     ED Prescriptions     Medication Sig Dispense Auth. Provider   ondansetron  (ZOFRAN -ODT) 4 MG disintegrating tablet Take 1 tablet (4 mg total) by mouth every 8 (eight) hours as needed for nausea or vomiting. 20 tablet Joleigh Mineau M, FNP   promethazine-dextromethorphan (PROMETHAZINE-DM) 6.25-15 MG/5ML syrup Take 5 mLs by mouth at bedtime as needed for cough. 118 mL Enedelia Dorna HERO, FNP      PDMP not reviewed this encounter.   Enedelia Dorna HERO, [place] 06/04/24 1524

## 2024-06-05 ENCOUNTER — Ambulatory Visit: Payer: Self-pay | Admitting: Nurse Practitioner

## 2024-06-05 DIAGNOSIS — N912 Amenorrhea, unspecified: Secondary | ICD-10-CM

## 2024-07-01 ENCOUNTER — Ambulatory Visit (HOSPITAL_COMMUNITY)
Admission: EM | Admit: 2024-07-01 | Discharge: 2024-07-01 | Disposition: A | Discharge status: Home / Self Care | Attending: Internal Medicine | Admitting: Internal Medicine

## 2024-07-01 ENCOUNTER — Encounter (HOSPITAL_COMMUNITY): Payer: Self-pay | Admitting: Emergency Medicine

## 2024-07-01 DIAGNOSIS — N3001 Acute cystitis with hematuria: Secondary | ICD-10-CM

## 2024-07-01 DIAGNOSIS — T3695XA Adverse effect of unspecified systemic antibiotic, initial encounter: Secondary | ICD-10-CM

## 2024-07-01 DIAGNOSIS — B379 Candidiasis, unspecified: Secondary | ICD-10-CM | POA: Diagnosis not present

## 2024-07-01 DIAGNOSIS — Z3202 Encounter for pregnancy test, result negative: Secondary | ICD-10-CM

## 2024-07-01 LAB — POCT URINE PREGNANCY: Preg Test, Ur: NEGATIVE

## 2024-07-01 LAB — POCT URINE DIPSTICK
Bilirubin, UA: NEGATIVE
Glucose, UA: NEGATIVE mg/dL
Nitrite, UA: NEGATIVE
Spec Grav, UA: 1.025 (ref 1.010–1.025)
Urobilinogen, UA: 0.2 U/dL
pH, UA: 5.5 (ref 5.0–8.0)

## 2024-07-01 MED ORDER — CEPHALEXIN 500 MG PO CAPS: 500.0000 mg | ORAL_CAPSULE | Freq: Two times a day (BID) | ORAL | 0 refills | Status: AC

## 2024-07-01 MED ORDER — FLUCONAZOLE 150 MG PO TABS: 150.0000 mg | ORAL_TABLET | ORAL | 0 refills | Status: DC

## 2024-07-01 NOTE — ED Provider Notes (Signed)
 [facility]    CSN: [id number] Arrival date & time: 07/01/24  1229      History   Chief Complaint Chief Complaint  Patient presents with   Dysuria    HPI Laura [name] is a 23 y.o. female.   Laura [name] is a 23 y.o. female presenting for chief complaint of Dysuria, urinary frequency, urinary urgency, and one episode of gross hematuria that started 5 days ago.  She feels pressure with voiding in the suprapubic region of the abdomen.  She denies low back pain, flank pain, nausea, vomiting, diarrhea, dizziness, fever, chills, headaches, and recent antibiotic or steroid use.  She drinks plenty of water and denies frequent intake of urinary irritants.  Denies use of SGLT2 inhibitor.  Last menstrual cycle started on May 23, 2024, she is unsure if she could be pregnant.  She denies vaginal symptoms.  She has not attempted use of any over-the-counter medications to help with symptoms prior to arrival.   Dysuria   Past Medical History:  Diagnosis Date   Migraines     Patient Active Problem List   Diagnosis Date Noted   Amenorrhea 05/24/2024   Strain of back 03/29/2024   Rhinorrhea 03/29/2024   Routine general medical examination at a health care facility 10/06/2023   Obesity (BMI 30-39.9) 10/06/2023   Gastroesophageal reflux disease 07/04/2023   Birth control counseling 07/08/2022   Chronic migraine without aura without status migrainosus, not intractable 07/08/2022    History reviewed. No pertinent surgical history.  OB History   No obstetric history on file.      Home Medications    Prior to Admission medications   Medication Sig Start Date End Date Taking? Authorizing Provider  cephALEXin  (KEFLEX ) 500 MG capsule Take 1 capsule (500 mg total) by mouth 2 (two) times daily for 7 days. 07/01/24 07/08/24 Yes StanhopeDorna HERO, FNP  fluconazole  (DIFLUCAN ) 150 MG tablet Take 1 tablet (150 mg total) by mouth every 7 (seven) days. 07/01/24  Yes Enedelia Dorna HERO, FNP  metFORMIN  (GLUCOPHAGE -XR) 500 MG 24 hr tablet Take 1 tablet (500 mg total) by mouth daily with breakfast. 05/25/24   McElwee, Tinnie LABOR, NP    Family History Family History  Problem Relation Age of Onset   Hypertension Mother     Social History Social History   Tobacco Use   Smoking status: Former    Types: Cigarettes    Passive exposure: Current   Smokeless tobacco: Never  Vaping Use   Vaping status: Never Used  Substance Use Topics   Alcohol use: Yes    Alcohol/week: 2.0 standard drinks of alcohol    Types: 2 Shots of liquor per week    Comment: Occassionally.   Drug use: Yes    Types: Marijuana    Comment: twice a week     Allergies   Patient has no known allergies.   Review of Systems Review of Systems  Genitourinary:  Positive for dysuria.  Per HPI   Physical Exam Triage Vital Signs ED Triage Vitals  Encounter Vitals Group     BP 07/01/24 1348 113/75     Girls Systolic BP Percentile --      Girls Diastolic BP Percentile --      Boys Systolic BP Percentile --      Boys Diastolic BP Percentile --      Pulse Rate 07/01/24 1348 75     Resp 07/01/24 1348 16     Temp 07/01/24 1348  98.2 F (36.8 C)     Temp Source 07/01/24 1348 Oral     SpO2 07/01/24 1348 97 %     Weight --      Height --      Head Circumference --      Peak Flow --      Pain Score 07/01/24 1347 0     Pain Loc --      Pain Education --      Exclude from Growth Chart --    No data found.  Updated Vital Signs BP 113/75 (BP Location: Left Arm)   Pulse 75   Temp 98.2 F (36.8 C) (Oral)   Resp 16   LMP 05/23/2024 (Exact Date)   SpO2 97%   Visual Acuity Right Eye Distance:   Left Eye Distance:   Bilateral Distance:    Right Eye Near:   Left Eye Near:    Bilateral Near:     Physical Exam Vitals and nursing note reviewed.  Constitutional:      Appearance: She is not ill-appearing or toxic-appearing.  HENT:     Head: Normocephalic and atraumatic.      Right Ear: Hearing and external ear normal.     Left Ear: Hearing and external ear normal.     Nose: Nose normal.     Mouth/Throat:     Lips: Pink.  Eyes:     General: Lids are normal. Vision grossly intact. Gaze aligned appropriately.     Extraocular Movements: Extraocular movements intact.     Conjunctiva/sclera: Conjunctivae normal.  Pulmonary:     Effort: Pulmonary effort is normal.  Abdominal:     General: Bowel sounds are normal.     Palpations: Abdomen is soft.     Tenderness: There is no abdominal tenderness. There is no right CVA tenderness, left CVA tenderness or guarding.  Musculoskeletal:     Cervical back: Neck supple.  Skin:    General: Skin is warm and dry.     Capillary Refill: Capillary refill takes less than 2 seconds.     Findings: No rash.  Neurological:     General: No focal deficit present.     Mental Status: She is alert and oriented to person, place, and time. Mental status is at baseline.     Cranial Nerves: No dysarthria or facial asymmetry.  Psychiatric:        Mood and Affect: Mood normal.        Speech: Speech normal.        Behavior: Behavior normal.        Thought Content: Thought content normal.        Judgment: Judgment normal.      UC Treatments / Results  Labs (all labs ordered are listed, but only abnormal results are displayed) Labs Reviewed  POCT URINE DIPSTICK - Abnormal; Notable for the following components:      Result Value   Clarity, UA hazy (*)    Ketones, POC UA small (15) (*)    Blood, UA trace-lysed (*)    Protein Ur, POC trace (*)    Leukocytes, UA Small (1+) (*)    All other components within normal limits  POCT URINE PREGNANCY - Normal  URINE CULTURE    EKG   Radiology No results found.  Procedures Procedures (including critical care time)  Medications Ordered in UC Medications - No data to display  Initial Impression / Assessment and Plan / UC Course  I have reviewed the  triage vital signs and the  nursing notes.  Pertinent labs & imaging results that were available during my care of the patient were reviewed by me and considered in my medical decision making (see chart for details).   1.  Acute cystitis with hematuria, antibiotic induced yeast infection, negative urine pregnancy test Evaluation suggests acute cystitis based on presentation and urinalysis findings in clinic.  Urine culture pending.  Low suspicion for acute pyelonephritis, kidney stone or infected stone.  Keflex  antibiotic ordered. Appears well hydrated, therefore will defer labs/imaging.  Vitals stable.  Patient to push fluids to stay well hydrated and reduce intake of known urinary irritants.  Discussed methods of preventing future UTI.  Patient requests Diflucan  to treat empirically for vaginal yeast infection as a result of antibiotic use.  Diflucan  has been sent to pharmacy. Urine pregnancy test is negative.  Counseled patient on potential for adverse effects with medications prescribed/recommended today, strict ER and return-to-clinic precautions discussed, patient verbalized understanding.    Final Clinical Impressions(s) / UC Diagnoses   Final diagnoses:  Acute cystitis with hematuria  Antibiotic-induced yeast infection  Negative pregnancy test     Discharge Instructions      Your urine shows you likely have a urinary tract infection.   I have sent your urine for culture to confirm this.   We will call you if we need to change your antibiotic when we find out the type of bacteria growing in your bladder.  Take antibiotic as directed with a snack/food to avoid stomach upset. To avoid GI upset please take this medication with food.   Avoid drinking beverages that irritate the urinary tract like sodas, tea, coffee, or juice. Drink plenty of water to stay well hydrated and prevent severe infection.  If you develop pain in your upper back on one side, fever despite taking antibiotic, nausea and  vomiting where you cannot keep anything down for 24 hours, dizziness/severe headache, or decreased urinary output, please go to the ER. Follow-up with PCP.      ED Prescriptions     Medication Sig Dispense Auth. Provider   cephALEXin  (KEFLEX ) 500 MG capsule Take 1 capsule (500 mg total) by mouth 2 (two) times daily for 7 days. 14 capsule Enedelia Dorna HERO, FNP   fluconazole  (DIFLUCAN ) 150 MG tablet Take 1 tablet (150 mg total) by mouth every 7 (seven) days. 2 tablet Enedelia Dorna HERO, FNP      PDMP not reviewed this encounter.   Enedelia Dorna HERO, [place] 07/01/24 1439

## 2024-07-01 NOTE — ED Triage Notes (Signed)
 Pt reports dysuria on 11/30 that also had blood in urine that day. Reports symptoms stopped then started again 12/4. Took ibuprofen 

## 2024-07-01 NOTE — Discharge Instructions (Signed)
 Your urine shows you likely have a urinary tract infection.   I have sent your urine for culture to confirm this.   We will call you if we need to change your antibiotic when we find out the type of bacteria growing in your bladder.  Take antibiotic as directed with a snack/food to avoid stomach upset. To avoid GI upset please take this medication with food.   Avoid drinking beverages that irritate the urinary tract like sodas, tea, coffee, or juice. Drink plenty of water  to stay well hydrated and prevent severe infection.  If you develop pain in your upper back on one side, fever despite taking antibiotic, nausea and vomiting where you cannot keep anything down for 24 hours, dizziness/severe headache, or decreased urinary output, please go to the ER. Follow-up with PCP.

## 2024-07-03 ENCOUNTER — Ambulatory Visit (HOSPITAL_COMMUNITY): Payer: Self-pay

## 2024-07-03 LAB — URINE CULTURE: Culture: 10000 — AB

## 2024-08-02 ENCOUNTER — Inpatient Hospital Stay

## 2024-08-02 ENCOUNTER — Inpatient Hospital Stay: Admitting: Physician Assistant

## 2024-08-07 ENCOUNTER — Inpatient Hospital Stay

## 2024-08-15 NOTE — Progress Notes (Addendum)
 [facility] CONSULT NOTE  Patient Care Team: Nedra Tinnie LABOR, NP as PCP - General (Internal Medicine)   ASSESSMENT & PLAN 24 y.o.female with history of migraine headache, GERD referred for decreased white blood cell counts.  Two records of moderate neutropenia. No anemia or thrombocytopenia.  Clinically without any concerning symptoms.  Patient is otherwise feeling well.  Discussed we will obtain basic testing today.  If concerning findings, patient will return for additional testing if needed.  Follow-up with telephone visit next week. Assessment & Plan Other neutropenia  Check CBC, LDH B12, folate, HIV Since no concerning symptoms, follow up trending is reasonable In the future may check RF, CCP  Orders Placed This Encounter  Procedures   CBC with Differential (Cancer Center Only)    Standing Status:   Future    Number of Occurrences:   1    Expiration Date:   08/16/2025   CMP (Cancer Center only)    Standing Status:   Future    Number of Occurrences:   1    Expiration Date:   08/16/2025   Lactate dehydrogenase    Standing Status:   Future    Number of Occurrences:   1    Expiration Date:   08/16/2025   Folate    Standing Status:   Future    Number of Occurrences:   1    Expiration Date:   08/16/2025   Vitamin B12    Standing Status:   Future    Number of Occurrences:   1    Expiration Date:   08/16/2025   HIV antibody (with reflex)    Standing Status:   Future    Number of Occurrences:   1    Expiration Date:   08/16/2025        All questions were answered. The patient knows to call the clinic with any problems, questions or concerns.  Pauletta JAYSON Chihuahua, MD 08/16/2024 12:09 PM   CHIEF COMPLAINTS/PURPOSE OF CONSULTATION:  Leukopenia   HISTORY OF PRESENTING ILLNESS:  Laura [name] 24 y.o. female is here because of decreased WBC.  08/09/22 WBC 3.6 ANC 0.9 hgb 13. MCV 86. Platelet 150 05/24/24 WBC 3.0 ANC 0.8 hgb 13.4 MCV 84. Platelet  166  Report history of irregular menstrual cycles.  On 1016/25 patient presented with rhinorrhea and cough, nasal congestion.  Diagnosis of viral URI was made.  Reports cellulitis of chest wall and was given cephalexin  for 7 days.  Earlier in the month, report patient presented with vaginal irritation.  Several episodes of candidal vaginitis reported.  Patient is otherwise feeling well.  Report recent infection has resolved.  She denies night sweats, weight loss, decreased appetite, lymphadenopathy, unexpected bleeding or bruising.  No joint pain, rash. She only takes metformin  and no other medications.  She is otherwise feeling well and working full-time at [employer].  MEDICAL HISTORY:  Past Medical History:  Diagnosis Date   Migraines     SURGICAL HISTORY: No past surgical history on file.  SOCIAL HISTORY: Social History   Socioeconomic History   Marital status: Single    Spouse name: Not on file   Number of children: Not on file   Years of education: Not on file   Highest education level: Not on file  Occupational History   Not on file  Tobacco Use   Smoking status: Former    Types: Cigarettes    Passive exposure: Current   Smokeless tobacco: Never  Vaping Use  Vaping status: Never Used  Substance and Sexual Activity   Alcohol use: Yes    Alcohol/week: 2.0 standard drinks of alcohol    Types: 2 Shots of liquor per week    Comment: Occassionally.   Drug use: Yes    Types: Marijuana    Comment: twice a week   Sexual activity: Not Currently    Birth control/protection: None  Other Topics Concern   Not on file  Social History Narrative   Not on file   Social Drivers of Health   Tobacco Use: Medium Risk (05/24/2024)   Patient History    Smoking Tobacco Use: Former    Smokeless Tobacco Use: Never    Passive Exposure: Current  Physicist, Medical Strain: Not on file  Food Insecurity: No Food Insecurity (08/16/2024)   Epic    Worried About Programme Researcher, Broadcasting/film/video in  the Last Year: Never true    Ran Out of Food in the Last Year: Never true  Transportation Needs: No Transportation Needs (08/16/2024)   Epic    Lack of Transportation (Medical): No    Lack of Transportation (Non-Medical): No  Physical Activity: Not on file  Stress: Not on file  Social Connections: Not on file  Intimate Partner Violence: Not on file  Depression (PHQ2-9): Low Risk (08/16/2024)   Depression (PHQ2-9)    PHQ-2 Score: 0  Alcohol Screen: Not on file  Housing: Unknown (08/16/2024)   Epic    Unable to Pay for Housing in the Last Year: No    Number of Times Moved in the Last Year: Not on file    Homeless in the Last Year: No  Utilities: Not At Risk (08/16/2024)   Epic    Threatened with loss of utilities: No  Health Literacy: Not on file    FAMILY HISTORY: Family History  Problem Relation Age of Onset   Hypertension Mother     ALLERGIES:  has no known allergies.  MEDICATIONS:  Current Outpatient Medications  Medication Sig Dispense Refill   metFORMIN  (GLUCOPHAGE -XR) 500 MG 24 hr tablet Take 1 tablet (500 mg total) by mouth daily with breakfast. 90 tablet 0   No current facility-administered medications for this visit.    REVIEW OF SYSTEMS:   All relevant systems were reviewed with the patient and are negative.  PHYSICAL EXAMINATION: ECOG PERFORMANCE STATUS: 0  Vitals:   08/16/24 1137  BP: 106/69  Pulse: 81  Resp: 16  Temp: (!) 97.5 F (36.4 C)  SpO2: 100%   Filed Weights   08/16/24 1137  Weight: 221 lb (100.2 kg)    GENERAL: alert, no distress and comfortable.  Overweight SKIN: skin color normal and no redness around hand joint EYES: sclera clear OROPHARYNX: no exudate  NECK: No palpable mass LYMPH:  no palpable cervical lymphadenopathy  LUNGS: clear to auscultation and percussion with normal breathing effort HEART: regular rate & rhythm and no murmurs  ABDOMEN: abdomen soft, non-tender and nondistended. Musculoskeletal: no joint swelling on  the hands   LABORATORY DATA:  I have reviewed the data as listed  Relevant labs reviewed.  New labs ordered.

## 2024-08-16 ENCOUNTER — Inpatient Hospital Stay

## 2024-08-16 VITALS — BP 106/69 | HR 81 | Temp 97.5°F | Resp 16 | Ht 62.0 in | Wt 221.0 lb

## 2024-08-16 DIAGNOSIS — Z87891 Personal history of nicotine dependence: Secondary | ICD-10-CM | POA: Diagnosis not present

## 2024-08-16 DIAGNOSIS — D708 Other neutropenia: Secondary | ICD-10-CM | POA: Insufficient documentation

## 2024-08-16 DIAGNOSIS — D709 Neutropenia, unspecified: Secondary | ICD-10-CM | POA: Insufficient documentation

## 2024-08-16 LAB — CBC WITH DIFFERENTIAL (CANCER CENTER ONLY)
Abs Immature Granulocytes: 0 K/uL (ref 0.00–0.07)
Basophils Absolute: 0 K/uL (ref 0.0–0.1)
Basophils Relative: 0 %
Eosinophils Absolute: 0 K/uL (ref 0.0–0.5)
Eosinophils Relative: 1 %
HCT: 38.6 % (ref 36.0–46.0)
Hemoglobin: 12.8 g/dL (ref 12.0–15.0)
Immature Granulocytes: 0 %
Lymphocytes Relative: 67 %
Lymphs Abs: 2.1 K/uL (ref 0.7–4.0)
MCH: 28.3 pg (ref 26.0–34.0)
MCHC: 33.2 g/dL (ref 30.0–36.0)
MCV: 85.2 fL (ref 80.0–100.0)
Monocytes Absolute: 0.2 K/uL (ref 0.1–1.0)
Monocytes Relative: 8 %
Neutro Abs: 0.8 K/uL — ABNORMAL LOW (ref 1.7–7.7)
Neutrophils Relative %: 24 %
Platelet Count: 165 K/uL (ref 150–400)
RBC: 4.53 MIL/uL (ref 3.87–5.11)
RDW: 13.2 % (ref 11.5–15.5)
WBC Count: 3.1 K/uL — ABNORMAL LOW (ref 4.0–10.5)
nRBC: 0 % (ref 0.0–0.2)

## 2024-08-16 LAB — CMP (CANCER CENTER ONLY)
ALT: 25 U/L (ref 0–44)
AST: 39 U/L (ref 15–41)
Albumin: 4.4 g/dL (ref 3.5–5.0)
Alkaline Phosphatase: 48 U/L (ref 38–126)
Anion gap: 10 (ref 5–15)
BUN: 9 mg/dL (ref 6–20)
CO2: 23 mmol/L (ref 22–32)
Calcium: 8.8 mg/dL — ABNORMAL LOW (ref 8.9–10.3)
Chloride: 105 mmol/L (ref 98–111)
Creatinine: 0.82 mg/dL (ref 0.44–1.00)
GFR, Estimated: >60 mL/min
Glucose, Bld: 89 mg/dL (ref 70–99)
Potassium: 3.9 mmol/L (ref 3.5–5.1)
Sodium: 138 mmol/L (ref 135–145)
Total Bilirubin: 0.4 mg/dL (ref 0.0–1.2)
Total Protein: 7.3 g/dL (ref 6.5–8.1)

## 2024-08-16 LAB — LACTATE DEHYDROGENASE: LDH: 215 U/L (ref 105–235)

## 2024-08-16 LAB — FOLATE: Folate: 10 ng/mL

## 2024-08-16 LAB — VITAMIN B12: Vitamin B-12: 358 pg/mL (ref 180–914)

## 2024-08-16 NOTE — Assessment & Plan Note (Addendum)
" °  Check CBC, LDH B12, folate, HIV Since no concerning symptoms, follow up trending is reasonable In the future may check RF, CCP "

## 2024-08-17 LAB — MISC LABCORP TEST (SEND OUT): Labcorp test code: 83935

## 2024-08-20 ENCOUNTER — Inpatient Hospital Stay

## 2024-08-20 DIAGNOSIS — D709 Neutropenia, unspecified: Secondary | ICD-10-CM | POA: Diagnosis not present

## 2024-08-20 NOTE — Assessment & Plan Note (Addendum)
 Stable chronic neutropenia w/out any symptoms Follow up in 6 months. CBC, CMP, b12, mma, RA, CCP in mid July Follow up about 2 weeks after blood draw.

## 2024-08-20 NOTE — Progress Notes (Signed)
 [facility] OFFICE PROGRESS NOTE  Patient Care Team: Nedra Tinnie LABOR, NP as PCP - General (Internal Medicine)  24 y.o.female with history of migraine headache, GERD referred for decreased white blood cell counts.  Telephone visit  Physician location: in [place]  Patient location: Home Total time for the encounter: 12 minutes    Two records of moderate neutropenia. No anemia or thrombocytopenia.  Clinically without any concerning symptoms.  Patient is otherwise feeling well.   Normal folate, low b12, and patient has been taking b12. She denies other symptoms. Discussed follow up in about 6 months, labs two weeks before visit. Assessment & Plan Neutropenia, unspecified type Stable chronic neutropenia w/out any symptoms Follow up in 6 months. CBC, CMP, b12, mma, RA, CCP in mid July Follow up about 2 weeks after blood draw.  Orders Placed This Encounter  Procedures   CBC with Differential (Cancer Center Only)    Standing Status:   Future    Expiration Date:   08/20/2025   CMP (Cancer Center only)    Standing Status:   Future    Expiration Date:   08/20/2025   Methylmalonic acid, serum    Standing Status:   Future    Expiration Date:   08/20/2025   Vitamin B12    Standing Status:   Future    Expiration Date:   08/20/2025   Rheumatoid factor    Standing Status:   Future    Expiration Date:   08/20/2025   Cyclic Citrul Peptide Ab, IgG, IgA    Standing Status:   Future    Expiration Date:   08/20/2025     Pauletta JAYSON Chihuahua, MD  INTERVAL HISTORY: Patient being follow-up with phone visit for results. No clinical change and feeling well.   Relevant data reviewed during this visit included lab results. New labs ordered.
